# Patient Record
Sex: Male | Born: 1990 | Race: Black or African American | Hispanic: No | Marital: Single | Smoking: Never smoker
Health system: Southern US, Community
[De-identification: ages and names within clinical notes are randomized; demographics above are authoritative.]

## PROBLEM LIST (undated history)

## (undated) DIAGNOSIS — G4730 Sleep apnea, unspecified: Secondary | ICD-10-CM

## (undated) DIAGNOSIS — F32A Depression, unspecified: Secondary | ICD-10-CM

## (undated) DIAGNOSIS — I10 Essential (primary) hypertension: Secondary | ICD-10-CM

## (undated) DIAGNOSIS — J45909 Unspecified asthma, uncomplicated: Secondary | ICD-10-CM

## (undated) DIAGNOSIS — F419 Anxiety disorder, unspecified: Secondary | ICD-10-CM

## (undated) HISTORY — PX: HERNIA REPAIR: SHX51

## (undated) HISTORY — DX: Unspecified asthma, uncomplicated: J45.909

## (undated) HISTORY — DX: Anxiety disorder, unspecified: F41.9

## (undated) HISTORY — DX: Depression, unspecified: F32.A

---
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
## 2020-03-26 ENCOUNTER — Other Ambulatory Visit: Payer: Self-pay

## 2020-03-26 ENCOUNTER — Encounter (HOSPITAL_COMMUNITY): Payer: Self-pay | Admitting: Emergency Medicine

## 2020-03-26 ENCOUNTER — Ambulatory Visit (INDEPENDENT_AMBULATORY_CARE_PROVIDER_SITE_OTHER): Payer: Self-pay

## 2020-03-26 ENCOUNTER — Ambulatory Visit (HOSPITAL_COMMUNITY)
Admission: EM | Admit: 2020-03-26 | Discharge: 2020-03-26 | Disposition: A | Discharge status: Home / Self Care | Payer: Self-pay | Attending: Family Medicine | Admitting: Family Medicine

## 2020-03-26 DIAGNOSIS — M549 Dorsalgia, unspecified: Secondary | ICD-10-CM

## 2020-03-26 DIAGNOSIS — M546 Pain in thoracic spine: Secondary | ICD-10-CM

## 2020-03-26 DIAGNOSIS — M62830 Muscle spasm of back: Secondary | ICD-10-CM

## 2020-03-26 DIAGNOSIS — M5489 Other dorsalgia: Secondary | ICD-10-CM

## 2020-03-26 HISTORY — DX: Essential (primary) hypertension: I10

## 2020-03-26 MED ORDER — KETOROLAC TROMETHAMINE 60 MG/2ML IM SOLN
60.0000 mg | Freq: Once | INTRAMUSCULAR | Status: AC
  Administered 2020-03-26: 60 mg via INTRAMUSCULAR

## 2020-03-26 MED ORDER — TIZANIDINE HCL 4 MG PO TABS: 4.0000 mg | ORAL_TABLET | Freq: Every day | ORAL | 0 refills | Status: DC

## 2020-03-26 MED ORDER — PREDNISONE 20 MG PO TABS: 40.0000 mg | ORAL_TABLET | Freq: Every day | ORAL | 0 refills | Status: DC

## 2020-03-26 MED ORDER — KETOROLAC TROMETHAMINE 60 MG/2ML IM SOLN
INTRAMUSCULAR | Status: AC
  Filled 2020-03-26: qty 2

## 2020-03-26 MED ORDER — DEXAMETHASONE SODIUM PHOSPHATE 10 MG/ML IJ SOLN
10.0000 mg | Freq: Once | INTRAMUSCULAR | Status: AC
  Administered 2020-03-26: 10 mg via INTRAMUSCULAR

## 2020-03-26 MED ORDER — DEXAMETHASONE SODIUM PHOSPHATE 10 MG/ML IJ SOLN
INTRAMUSCULAR | Status: AC
  Filled 2020-03-26: qty 1

## 2020-03-26 NOTE — Discharge Instructions (Addendum)
Your x-ray today is significant for degenerative disc disease which is consistent with arthritis of the back.  This is a chronic condition that will have intermittent flares of back pain which is typically controlled with over-the-counter anti-inflammatories and applications of heat.  Weight loss is encouraged to reduce the frequency of recurrent back pain. Start oral prednisone tomorrow as she did receive a prednisone injection today in clinic.  You may take the muscle relaxers as directed today.  If symptoms worsen or do not improve follow-up with your primary care provider or the Ortho office that I have listed on your discharge paperwork.  Take medication as prescribed.  Avoid taking muscle relaxers while driving as this can induce drowsiness.

## 2020-03-26 NOTE — ED Triage Notes (Signed)
Pt c/o muscle spasms in his mid back onset yesterday. He states the pain is worse with ROM and he has to keep his arm elevated to stop the pain.

## 2020-03-26 NOTE — ED Provider Notes (Signed)
[facility]    CSN: [id number] Arrival date & time: 03/26/20  1022      History   Chief Complaint Chief Complaint  Patient presents with  . Back Pain    HPI Maurice Evans is a 29 y.o. male.   HPI  Maurice Evans is a 29 y.o. male who complains of an injury causing left sided mid back pain for over 1 week.  Unknown if injury occurred.  He reports the pain worsens with certain movements.  He is unable to lay on his back as this seems to worsen the pain.  Pain improves with raising of his right arm or while sitting and offloading pressure on the right side of his body.  He denies any history of recurrent back pain.  He has not taken any medications today for pain.  Denies any pain or changes in urination patterns or urine color.  No history of renal stones. Past Medical History:  Diagnosis Date  . Hypertension     There are no problems to display for this patient.   Past Surgical History:  Procedure Laterality Date  . HERNIA REPAIR         Home Medications    Prior to Admission medications   Not on File    Family History Family History  Problem Relation Age of Onset  . Hypertension Mother   . Thyroid disease Father     Social History Social History   Tobacco Use  . Smoking status: Never Smoker  . Smokeless tobacco: Never Used  Vaping Use  . Vaping Use: Never used  Substance Use Topics  . Alcohol use: Yes    Comment: occasionally  . Drug use: Yes    Frequency: 3.0 times per week    Types: Marijuana     Allergies   Sulfamethoxazole-trimethoprim   Review of Systems Review of Systems Pertinent negatives listed in HPI   Physical Exam Triage Vital Signs ED Triage Vitals  Enc Vitals Group     BP 03/26/20 1147 (!) 131/93     Pulse Rate 03/26/20 1147 83     Resp 03/26/20 1147 18     Temp 03/26/20 1147 98.9 F (37.2 C)     Temp Source 03/26/20 1147 Oral     SpO2 03/26/20 1147 97 %     Weight --      Height --      Head  Circumference --      Peak Flow --      Pain Score 03/26/20 1142 8     Pain Loc --      Pain Edu? --      Excl. in GC? --    No data found.  Updated Vital Signs BP (!) 131/93 (BP Location: Left Arm)   Pulse 83   Temp 98.9 F (37.2 C) (Oral)   Resp 18   SpO2 97%   Visual Acuity Right Eye Distance:   Left Eye Distance:   Bilateral Distance:    Right Eye Near:   Left Eye Near:    Bilateral Near:     Physical Exam   UC Treatments / Results  Labs (all labs ordered are listed, but only abnormal results are displayed) Labs Reviewed - No data to display  EKG   Radiology No results found.  Procedures Procedures (including critical care time)  Medications Ordered in UC Medications - No data to display  Initial Impression / Assessment and Plan / UC Course  I have reviewed the triage vital signs and the nursing notes.  Pertinent labs & imaging results that were available during my care of the patient were reviewed by me and considered in my medical decision making (see chart for details).     Presents with acute unilateral left-sided back pain gradually worsening over the course of week. Imaging significant for degenerative disc disease.  Will cover with a short course of oral prednisone starting tomorrow and for pain muscle relaxer.  Patient advised to follow-up with Ortho or PCP if symptoms worsen and do not improve. Final Clinical Impressions(s) / UC Diagnoses   Final diagnoses:  Back pain  Back muscle spasm     Discharge Instructions     If symptoms worsen or do not improve follow-up with your primary care provider or the Ortho office that I have listed on your discharge paperwork.  Take medication as prescribed.  Avoid taking muscle relaxers while driving as this can induce drowsiness.    ED Prescriptions    None     PDMP not reviewed this encounter.   Bing Neighbors, FNP 03/26/20 1243

## 2020-07-06 ENCOUNTER — Ambulatory Visit: Payer: Self-pay | Admitting: Family Medicine

## 2020-07-06 ENCOUNTER — Encounter: Payer: Self-pay | Admitting: Family Medicine

## 2020-07-06 ENCOUNTER — Other Ambulatory Visit: Payer: Self-pay

## 2020-07-06 ENCOUNTER — Ambulatory Visit (INDEPENDENT_AMBULATORY_CARE_PROVIDER_SITE_OTHER): Payer: BC Managed Care – PPO | Admitting: Family Medicine

## 2020-07-06 VITALS — BP 110/80 | HR 92 | Ht 65.1 in | Wt 287.0 lb

## 2020-07-06 DIAGNOSIS — Z7689 Persons encountering health services in other specified circumstances: Secondary | ICD-10-CM

## 2020-07-06 DIAGNOSIS — Z1159 Encounter for screening for other viral diseases: Secondary | ICD-10-CM

## 2020-07-06 DIAGNOSIS — G4733 Obstructive sleep apnea (adult) (pediatric): Secondary | ICD-10-CM | POA: Insufficient documentation

## 2020-07-06 DIAGNOSIS — Z131 Encounter for screening for diabetes mellitus: Secondary | ICD-10-CM | POA: Diagnosis not present

## 2020-07-06 DIAGNOSIS — R12 Heartburn: Secondary | ICD-10-CM | POA: Insufficient documentation

## 2020-07-06 DIAGNOSIS — R030 Elevated blood-pressure reading, without diagnosis of hypertension: Secondary | ICD-10-CM

## 2020-07-06 DIAGNOSIS — R0683 Snoring: Secondary | ICD-10-CM | POA: Diagnosis not present

## 2020-07-06 DIAGNOSIS — Z114 Encounter for screening for human immunodeficiency virus [HIV]: Secondary | ICD-10-CM

## 2020-07-06 DIAGNOSIS — Z13220 Encounter for screening for lipoid disorders: Secondary | ICD-10-CM

## 2020-07-06 DIAGNOSIS — F322 Major depressive disorder, single episode, severe without psychotic features: Secondary | ICD-10-CM

## 2020-07-06 DIAGNOSIS — Z130 Encounter for screening for diseases of the blood and blood-forming organs and certain disorders involving the immune mechanism: Secondary | ICD-10-CM

## 2020-07-06 DIAGNOSIS — K219 Gastro-esophageal reflux disease without esophagitis: Secondary | ICD-10-CM | POA: Insufficient documentation

## 2020-07-06 LAB — POCT GLYCOSYLATED HEMOGLOBIN (HGB A1C): Hemoglobin A1C: 5.3 % (ref 4.0–5.6)

## 2020-07-06 NOTE — Assessment & Plan Note (Signed)
>>  ASSESSMENT AND PLAN FOR GERD (GASTROESOPHAGEAL REFLUX DISEASE) WRITTEN ON 07/06/2020  1:02 PM BY MULLIS, KIERSTEN P, DO  Endorses symptoms after certain meals and at night. Piror treatments include prilosec, tums. Minimal improvement with PRN use. - follow up to further evaluate - Consider H.Pylori screen initially then starting daily PPI vs H2 blocker

## 2020-07-06 NOTE — Progress Notes (Signed)
Subjective:    Patient ID: Maurice Evans, male    DOB: Dec 08, 1990, 30 y.o.   MRN: [id number]   CC: New Patient  HPI: PMHx: Past Medical History:  Diagnosis Date  . Anxiety   . Asthma    possibly  . Depression   . Hypertension      Surgical Hx: Past Surgical History:  Procedure Laterality Date  . HERNIA REPAIR    . MASTECTOMY FOR GYNECOMASTIA Bilateral 2005    Family Hx: Family History  Problem Relation Age of Onset  . Hypertension Mother   . Hyperlipidemia Mother   . 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 He has never used smokeless tobacco. He reports current alcohol use. He reports current drug use. Frequency: 3.00 times per week. Drug: Marijuana.  Medications: None  ROS: snoring, apnea spells at night, reflux, depression/anxiety Patient reports no  vision/ hearing changes,anorexia, weight change, fever ,adenopathy, persistant / recurrent hoarseness, swallowing issues, chest pain, edema,persistant / recurrent cough, hemoptysis, dyspnea(rest, exertional, paroxysmal nocturnal), gastrointestinal  bleeding (melena, rectal bleeding), abdominal pain, GU symptoms(dysuria, hematuria, pyuria, voiding/incontinence  Issues), syncope, focal weakness, severe memory loss, concerning skin lesions, abnormal bruising/bleeding, major joint swelling.    Preventative Screening Colonoscopy: None PSA: None DEXA:  None Tetanus vaccine: Many years ago Pneumonia vaccine: No Shingles vaccine: No  Heart stress test: none Echocardiogram: none Xrays: Thoracic spine 03/26/20: Mild scoliosis. Diffuse degenerative change. No acute abnormality. CT/MRI: none  Health Maintenance Due  Topic Date Due  . Hepatitis C Screening  Never done  . TETANUS/TDAP  Never done  . INFLUENZA VACCINE  Never done     Smoking status reviewed  Review of Systems   Objective:  BP 110/80   Pulse 92   Ht 5' 5.1" (1.654 m)   Wt 287 lb (130.2 kg)   SpO2 96%   BMI 47.61 kg/m  Vitals and nursing note reviewed  General: well nourished, in no acute distress HEENT: normocephalic, no scleral icterus or conjunctival pallor, no nasal discharge, moist mucous membranes, good dentition without erythema or discharge noted in posterior oropharynx, Friedman tongue position: Grade 2, Mallampati score of Grade 2.5  Neck: supple Cardiac: RRR, clear S1 and S2, no murmurs, rubs, or gallops Respiratory: clear to auscultation bilaterally, no increased work of breathing Neuro: alert and oriented, no focal deficits  Depression screen PHQ 2/9 07/06/2020  Decreased Interest 3  Down, Depressed, Hopeless 3  PHQ - 2 Score 6  Altered sleeping 0  Tired, decreased energy 1  Change in appetite 3  Feeling bad or failure about yourself  3  Trouble concentrating 2  Moving slowly or fidgety/restless 3  Suicidal thoughts 0  PHQ-9 Score 18   Assessment & Plan:   Establish care: Patient here today to establish care. PMH, surgical history, and social history were reviewed. The following concerns below were discussed.   Blood pressure elevated without history of HTN Normotensive today. Continue to monitor.  Risk stratifying labs today: A1C, lipid panel, BMP, CBC  Depression, major, single episode, severe (HCC) Chronic. Worsened after passing of father in 2014. Discussed management options including therapy +/- pharmacologic therapy. Patient  opted to think over management options. Follow up at earliest convenience to further discuss. Denies passive or active SI/HI.  Heart burn Endorses symptoms after certain meals and at night. Piror treatments include prilosec, tums. Minimal improvement with PRN use. - follow up to further evaluate - Consider H.Pylori screen initially then starting daily PPI vs H2 blocker  Snoring Significant other endorses snoring and apnea events throughout the night.  Friedman tongue position: Grade 2, Mallampati score of Grade 2.5  Plan to obtain neck circumference, STOP-BANG and Epworth Sleepiness scale at follow up visit Likely benefit from sleep study   Health Maintenance: - due for TDAP  - due for Flu - patient to get today however opted to wait for follow up visit - HIV and Hep C screening obtained today  No follow-ups on file.   Joana Reamer, DO [facility], PGY3 07/06/2020 1:03 PM

## 2020-07-06 NOTE — Assessment & Plan Note (Signed)
Normotensive today. Continue to monitor.  Risk stratifying labs today: A1C, lipid panel, BMP, CBC

## 2020-07-06 NOTE — Patient Instructions (Signed)
It was a pleasure to see you today!  Thank you for choosing [facility] for your primary care.   Our plans for today were:  I am getting blood work today. I will call you if anything is abnormal.    To keep you healthy, please keep in mind the following health maintenance items that you are due for:   1. Tetanus vaccine  Best Wishes,   Orpah Cobb, DO

## 2020-07-06 NOTE — Assessment & Plan Note (Signed)
Chronic. Worsened after passing of father in 2014. Discussed management options including therapy +/- pharmacologic therapy. Patient opted to think over management options. Follow up at earliest convenience to further discuss. Denies passive or active SI/HI.

## 2020-07-06 NOTE — Assessment & Plan Note (Signed)
Endorses symptoms after certain meals and at night. Piror treatments include prilosec, tums. Minimal improvement with PRN use. - follow up to further evaluate - Consider H.Pylori screen initially then starting daily PPI vs H2 blocker

## 2020-07-06 NOTE — Assessment & Plan Note (Signed)
Significant other endorses snoring and apnea events throughout the night.  Friedman tongue position: Grade 2, Mallampati score of Grade 2.5  Plan to obtain neck circumference, STOP-BANG and Epworth Sleepiness scale at follow up visit Likely benefit from sleep study

## 2020-07-07 ENCOUNTER — Encounter: Payer: Self-pay | Admitting: Family Medicine

## 2020-07-07 LAB — BASIC METABOLIC PANEL
BUN/Creatinine Ratio: 11 (ref 9–20)
BUN: 9 mg/dL (ref 6–20)
CO2: 26 mmol/L (ref 20–29)
Calcium: 9.1 mg/dL (ref 8.7–10.2)
Chloride: 105 mmol/L (ref 96–106)
Creatinine, Ser: 0.83 mg/dL (ref 0.76–1.27)
GFR calc Af Amer: 136 mL/min/{1.73_m2} (ref >59)
GFR calc non Af Amer: 118 mL/min/{1.73_m2} (ref >59)
Glucose: 88 mg/dL (ref 65–99)
Potassium: 4.6 mmol/L (ref 3.5–5.2)
Sodium: 142 mmol/L (ref 134–144)

## 2020-07-07 LAB — CBC
Hematocrit: 40 % (ref 37.5–51.0)
Hemoglobin: 13.3 g/dL (ref 13.0–17.7)
MCH: 29.8 pg (ref 26.6–33.0)
MCHC: 33.3 g/dL (ref 31.5–35.7)
MCV: 90 fL (ref 79–97)
Platelets: 273 10*3/uL (ref 150–450)
RBC: 4.46 x10E6/uL (ref 4.14–5.80)
RDW: 12.3 % (ref 11.6–15.4)
WBC: 5.8 10*3/uL (ref 3.4–10.8)

## 2020-07-07 LAB — LIPID PANEL
Chol/HDL Ratio: 2.5 ratio (ref 0.0–5.0)
Cholesterol, Total: 147 mg/dL (ref 100–199)
HDL: 58 mg/dL (ref >39)
LDL Chol Calc (NIH): 79 mg/dL (ref 0–99)
Triglycerides: 46 mg/dL (ref 0–149)
VLDL Cholesterol Cal: 10 mg/dL (ref 5–40)

## 2020-07-07 LAB — HIV ANTIBODY (ROUTINE TESTING W REFLEX): HIV Screen 4th Generation wRfx: NONREACTIVE

## 2020-07-07 LAB — HEPATITIS C ANTIBODY: Hep C Virus Ab: <0.1 s/co ratio (ref 0.0–0.9)

## 2020-09-16 ENCOUNTER — Ambulatory Visit: Payer: BC Managed Care – PPO | Admitting: Orthopaedic Surgery

## 2020-10-23 ENCOUNTER — Ambulatory Visit (INDEPENDENT_AMBULATORY_CARE_PROVIDER_SITE_OTHER): Payer: BC Managed Care – PPO | Admitting: Family Medicine

## 2020-10-23 ENCOUNTER — Other Ambulatory Visit: Payer: Self-pay

## 2020-10-23 VITALS — BP 124/84 | HR 105

## 2020-10-23 DIAGNOSIS — J069 Acute upper respiratory infection, unspecified: Secondary | ICD-10-CM | POA: Insufficient documentation

## 2020-10-23 NOTE — Progress Notes (Signed)
    SUBJECTIVE:   CHIEF COMPLAINT / HPI:   Cough/congestion: Patient is a 30 year old male who presents today for cough and congestion which started about 4 days. He states he now has a lot of sinus pressure and running nose and some watering of his right eye he thinks is due to the sinus pressure. No fevers. No shortness of breath, vomiting, diarrhea, no sick contacts.   PERTINENT  PMH / PSH: History of seasonal allergies  OBJECTIVE:   BP 124/84   Pulse (!) 105   SpO2 96%    88bmp recheck  General: NAD, pleasant, able to participate in exam HEENT: No pharyngeal erythema, significant congestion present in his nares bilaterally, patient has red watery eyes on physical exam. Cardiac: RRR, no murmurs. Respiratory: CTAB, normal effort, No wheezes, rales or rhonchi Abdomen: Bowel sounds present, nontender Skin: warm and dry, no rashes noted  ASSESSMENT/PLAN:   Viral URI with cough Assessment: 30 y.o. male with symptoms consistent with a viral upper respiratory tract infection.  Patient does not have any concerning symptoms for hospitalization.  No shortness of breath.  Overall differential can include seasonal allergies with postnasal drip leading to the cough and runny nose versus viral URI which can include common cold, influenza, COVID-19, or number of other respiratory viruses.  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  I believe that the patient likely has a viral upper respiratory infection, likely viral sinusitis though seasonal allergies may be to blame.  Unlikely bacterial at this time with his symptoms only going on for about 4 days, no fevers, although the patient could potentially develop sinus infection due to his congestion. Plan: -We will send for COVID-19 testing -Discussed return precautions -Discussed symptomatic treatment and the lack of need for antibiotics.    Jackelyn Poling, DO [facility]    This  note was prepared using Dragon voice recognition software and may include unintentional dictation errors due to the inherent limitations of voice recognition software.

## 2020-10-23 NOTE — Patient Instructions (Signed)
You were evaluated today for a likely upper viral respiratory infection.  We are doing a COVID-19 test.  You should Stata work until it returns.  If you develop any trouble breathing, chest pains, vomiting to the extent that she cannot keep down fluids you should go to the emergency department  You can try to use some symptomatic treatment including steroid nasal sprays such as Flonase, saline flushes of the nose, and sometimes warm beverages can help.  Your symptoms should improve in the next 3 to 4 days.  If they have not improved in the next 1 week please follow-up

## 2020-10-23 NOTE — Assessment & Plan Note (Signed)
Assessment: 30 y.o. male with symptoms consistent with a viral upper respiratory tract infection.  Patient does not have any concerning symptoms for hospitalization.  No shortness of breath.  Overall differential can include seasonal allergies with postnasal drip leading to the cough and runny nose versus viral URI which can include common cold, influenza, COVID-19, or number of other respiratory viruses.  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  I believe that the patient likely has a viral upper respiratory infection, likely viral sinusitis though seasonal allergies may be to blame.  Unlikely bacterial at this time with his symptoms only going on for about 4 days, no fevers, although the patient could potentially develop sinus infection due to his congestion. Plan: -We will send for COVID-19 testing -Discussed return precautions -Discussed symptomatic treatment and the lack of need for antibiotics.

## 2020-10-24 LAB — SARS-COV-2, NAA 2 DAY TAT

## 2020-10-24 LAB — NOVEL CORONAVIRUS, NAA: SARS-CoV-2, NAA: NOT DETECTED

## 2020-12-18 ENCOUNTER — Ambulatory Visit: Payer: BC Managed Care – PPO

## 2021-03-22 ENCOUNTER — Encounter: Payer: Self-pay | Admitting: Family Medicine

## 2021-03-22 ENCOUNTER — Ambulatory Visit: Payer: BC Managed Care – PPO | Admitting: Family Medicine

## 2021-03-22 ENCOUNTER — Ambulatory Visit (INDEPENDENT_AMBULATORY_CARE_PROVIDER_SITE_OTHER): Payer: BC Managed Care – PPO

## 2021-03-22 ENCOUNTER — Other Ambulatory Visit: Payer: Self-pay

## 2021-03-22 VITALS — BP 128/83 | HR 76 | Wt 280.8 lb

## 2021-03-22 DIAGNOSIS — R0683 Snoring: Secondary | ICD-10-CM

## 2021-03-22 DIAGNOSIS — Z23 Encounter for immunization: Secondary | ICD-10-CM | POA: Diagnosis not present

## 2021-03-22 DIAGNOSIS — F411 Generalized anxiety disorder: Secondary | ICD-10-CM | POA: Insufficient documentation

## 2021-03-22 DIAGNOSIS — Z0000 Encounter for general adult medical examination without abnormal findings: Secondary | ICD-10-CM

## 2021-03-22 DIAGNOSIS — B353 Tinea pedis: Secondary | ICD-10-CM | POA: Diagnosis not present

## 2021-03-22 DIAGNOSIS — G4719 Other hypersomnia: Secondary | ICD-10-CM

## 2021-03-22 DIAGNOSIS — F419 Anxiety disorder, unspecified: Secondary | ICD-10-CM

## 2021-03-22 DIAGNOSIS — J301 Allergic rhinitis due to pollen: Secondary | ICD-10-CM

## 2021-03-22 DIAGNOSIS — L989 Disorder of the skin and subcutaneous tissue, unspecified: Secondary | ICD-10-CM | POA: Insufficient documentation

## 2021-03-22 MED ORDER — KETOCONAZOLE 2 % EX CREA: 1.0000 "application " | TOPICAL_CREAM | Freq: Two times a day (BID) | CUTANEOUS | 0 refills | Status: DC

## 2021-03-22 MED ORDER — FLUTICASONE PROPIONATE 50 MCG/ACT NA SUSP: 2.0000 | Freq: Every day | NASAL | 6 refills | Status: DC

## 2021-03-22 NOTE — Patient Instructions (Signed)
It was wonderful to see you today.  Please bring ALL of your medications with you to every visit.   Today we talked about:  -- You will be called by chronic care management team member about therapy for your anxiety.  -- For your reflux please do not eat 30 minutes before going to bed.  I also recommend limiting alcohol use as this can worsen reflux.  We will check your calcium level today.  We will check your diabetes numbers today.  For your feet I sent in a cream to use twice a day for 2 to 4 weeks.  You may need to use this intermittently.  Please also wash your socks really well and change your socks frequently.   -You will be called about the sleep test.  --Please schedule follow-up with your primary doctor in 2 weeks to check in.  -- For your allergies I sent in a nasal spray.  I do recommend restarting your over-the-counter allergy pill such as Claritin.  Thank you for choosing [facility].   Please call [phone] with any questions about today's appointment.  Please be sure to schedule follow up at the front  desk before you leave today.   Terisa Starr, MD   If you are feeling suicidal or depression symptoms worsen please immediately go to:   If you are thinking about harming yourself or having thoughts of suicide, or if you know someone who is, seek help right away. If you are in crisis, make sure you are not left alone.  If someone else is in crisis, make sure he/she/they is not left alone  Call 988 OR 1-800-273-TALK  24 Hour Availability for Walk-IN services  [facility]  [address] [place], [place] [facility] [place] [phone] Crisis [phone]    Other crisis resources:  [facility] [employer] (Domestic Violence, Rape & Victim Assistance [phone]  [facility] [employer] Crisis [facility]    (ONLY from 8am-4pm)    [phone]  [facility] Mobile Crisis Unit (24/7)    [phone]  [place] National Suicide Hotline   [phone] Len Childs)

## 2021-03-22 NOTE — Assessment & Plan Note (Signed)
Discussed at length with patient.  Offered resources.  The suspect alcohol and cannabis may be worsening problem.  Referral to counseling through [facility].  Recommend GAD-7 and MDQ to confirm he does not have symptoms of bipolar at follow-up.  Could consider starting SSRI therapy fluoxetine or similar regimen.

## 2021-03-22 NOTE — Progress Notes (Signed)
SUBJECTIVE:   Chief compliant/HPI: annual examination  Maurice Evans is a 30 y.o. who presents today for an annual exam.   Current concerns: Patient is joined by his girlfriend.  They report the following concerns: Snoring, concern about diabetes, blood pressure, feet infection anxiety and cough.  Cough Patient reports a several day history of mild cough, congestion and phlegm.  He denies chest pain, dyspnea on exertion, lower extremity edema.  He denies fevers.  He is COVID vaccinated.  He does work as a Solicitor.  He reports typical of his seasonal allergies and has some postnasal drip.  Foot infection The patient reports a rash on his foot.  His girlfriend thinks this may be calluses.  Reports intermittently pruritic.  Denies fevers or chills.  Excessive daytime sleepiness Patient reports he thinks he is sleep apnea.  He snores at night.  His family history is significant for sleep apnea.  His wife reports he sometimes does wake up at night.  He typically sleeps on his stomach.  He endorses excessive daytime sleepiness.  He typically drinks between 24 to 40 ounces of beer per night.  Anxiety The patient reports is a longstanding history of anxiety.  He also reports a low mood.  His girlfriend confirms this.  He denies thoughts of hurting himself or others.  He does endorse occasional thoughts of where he may not want to live.  He denies any specific plan for suicidality.  He reports his anxiety is worse when he is outside around people.  He has to wear sunglasses all the time as this helps with his anxiety.  He does report he self medicates by drinking approximately 20 to 40 ounces of beer tonight and smoking cannabis.  He reports that he has side effects from these which can worsen his anxiety.  He is very interested in counseling.    The patient would also like to address his GERD.  We only addressed briefly.  He is in a longstanding history of heartburn and takes 3 to 4  pills of Tums per day.  We discussed this and he is agreeable to discussing further follow-up.   Review of systems form notable for negative for headaches, chest pain, dyspnea, abdominal pain, fevers.Marland Kitchen   Updated history tabs and problem list yes and family history updated to include the fact that his mother has diabetes..   OBJECTIVE:   BP 128/83   Pulse 76   Wt 280 lb 12.8 oz (127.4 kg)   SpO2 98%   BMI 46.58 kg/m   Well-appearing man in no distress.  Neck is thick there is no thyromegaly.  Regular rate and rhythm no murmurs rubs or gallops.  Lungs clear bilaterally.  Bilateral feet examined.  There is thickened hyperpigmentation with overlying scale between digits 3 4 and 4 and 5 on the left foot as well as some extending of the plantar aspect of the left foot.  There is minimal hyperpigmentation or plaque on the right interdigital area.  ASSESSMENT/PLAN:   Snoring Referred for sleep study.   Anxiety Discussed at length with patient.  Offered resources.  The suspect alcohol and cannabis may be worsening problem.  Referral to counseling through [facility].  Recommend GAD-7 and MDQ to confirm he does not have symptoms of bipolar at follow-up.  Could consider starting SSRI therapy fluoxetine or similar regimen.    Elevated BP, monitor, consider 24 hour monitor at follow up  Tinea Pedis, ketoconazole for 2 weeks. Recheck and  may need extended course   Obesity- A1C today.   Annual Examination  See AVS for age appropriate recommendations.   PHQ score 19, reviewed and discussed. Blood pressure reviewed and at goal .  Discussed reducing alcohol use and brief counseling provided. Discussed reducing cannabis use [place] counseling provided. We discussed the potential detrimental health effects of both of these substances.  Based upon USPSTF  guidelines - A1C today - TSH for anxiety, not screening  - Renal function panel given tums use    Follow up in 2 weeks with PCP-- discuss GERD and  anxiety at follow up. Consider 24 hour BP monitor.    Westley Chandler, MD [facility]  To the Is just

## 2021-03-22 NOTE — Assessment & Plan Note (Signed)
-   Referred for sleep study.

## 2021-03-23 ENCOUNTER — Telehealth: Payer: Self-pay | Admitting: Family Medicine

## 2021-03-23 ENCOUNTER — Telehealth: Payer: Self-pay | Admitting: *Deleted

## 2021-03-23 LAB — RENAL FUNCTION PANEL
Albumin: 4.7 g/dL (ref 4.0–5.0)
BUN/Creatinine Ratio: 13 (ref 9–20)
BUN: 11 mg/dL (ref 6–20)
CO2: 23 mmol/L (ref 20–29)
Calcium: 9.8 mg/dL (ref 8.7–10.2)
Chloride: 102 mmol/L (ref 96–106)
Creatinine, Ser: 0.88 mg/dL (ref 0.76–1.27)
Glucose: 91 mg/dL (ref 70–99)
Phosphorus: 3.9 mg/dL (ref 2.8–4.1)
Potassium: 4.4 mmol/L (ref 3.5–5.2)
Sodium: 139 mmol/L (ref 134–144)
eGFR: 118 mL/min/{1.73_m2} (ref >59)

## 2021-03-23 LAB — HEMOGLOBIN A1C
Est. average glucose Bld gHb Est-mCnc: 111 mg/dL
Hgb A1c MFr Bld: 5.5 % (ref 4.8–5.6)

## 2021-03-23 LAB — TSH: TSH: 1.08 u[IU]/mL (ref 0.450–4.500)

## 2021-03-23 NOTE — Telephone Encounter (Signed)
Called with normal results.  All questions answered.  Angelena Sand, MD  Family Medicine Teaching Service   

## 2021-03-23 NOTE — Chronic Care Management (AMB) (Signed)
  Care Management   Note  03/23/2021 Name: [name] MRN: [id number] DOB: April 18, 1991  Maurice Evans is a 30 y.o. year old male who is a primary care patient of Jackelyn Poling, DO. I reached out to Carson Myrtle by phone today in response to a referral sent by Mr. Corinna Capra primary care provider.   Mr. Allegretto was given information about care management services today including:  Care management services include personalized support from designated clinical staff supervised by his physician, including individualized plan of care and coordination with other care providers 24/7 contact phone numbers for assistance for urgent and routine care needs. The patient may stop care management services at any time by phone call to the office staff.  Patient agreed to services and verbal consent obtained.   Follow up plan: Telephone appointment with care management team member scheduled for:03/31/21  [facility] Guide, Embedded Care Coordination [facility]  Care Management  Direct Dial: [phone]

## 2021-03-31 ENCOUNTER — Ambulatory Visit: Payer: BC Managed Care – PPO | Admitting: Licensed Clinical Social Worker

## 2021-03-31 NOTE — Chronic Care Management (AMB) (Signed)
    Clinical Social Work  Care Management   Phone Outreach    03/31/2021 Name: Maurice Evans MRN: [id number] DOB: March 26, 1991  Maurice Evans is a 30 y.o. year old male who is a primary care patient of Jackelyn Poling, DO .   Reason for referral: Mental Health Counseling and Resources.    [facility] LCSW reached out to patient today by phone to introduce self, assess needs and offer Care Management services and interventions.    Unable to keep phone appointment today and requested to reschedule.  Plan:Appointment was rescheduled with [facility] LCSW for 04/01/21 at 8:15  Review of patient status, including review of consultants reports, relevant laboratory and other test results, and collaboration with appropriate care team members and the patient's provider was performed as part of comprehensive patient evaluation and provision of care management services.    Sammuel Hines, LCSW Care Management & Coordination  [facility] / [employer]   [phone]

## 2021-03-31 NOTE — Patient Instructions (Signed)
   It was a pleasure speaking with you today. I am sorry you were unable to keep your phone appointment today.   per your request your appointment is scheduled 04/01/2021  Sammuel Hines, LCSW Care Management & Coordination  [phone]

## 2021-04-01 ENCOUNTER — Ambulatory Visit: Payer: BC Managed Care – PPO | Admitting: Licensed Clinical Social Worker

## 2021-04-01 DIAGNOSIS — Z7189 Other specified counseling: Secondary | ICD-10-CM

## 2021-04-01 NOTE — Patient Instructions (Signed)
Licensed Clinical Social Worker Visit Information  Goals we discussed today:   Goals Addressed             This Visit's Progress    Begin and Stick with Counseling-Depression       Timeframe:  Short-Term Goal Priority:  High Start Date:     04/01/21                        Expected End Date:                       Follow Up Date 04/08/21     Patient Goals/Self-Care Activities: Over the next 7 days Review the list of counseling agencies e-mailed Call the one that you like to schedule your counseling appointment. Review your EMMI educational information on Relieving Stress and Breathing to Relax         Materials provided: Yes:    Maurice Evans was given information about Care Management services today including:  Care Management services include personalized support from designated clinical staff supervised by his physician, including individualized plan of care and coordination with other care providers 24/7 contact phone numbers for assistance for urgent and routine care needs. The patient may stop Care Management services at any time by phone call to the office staff.  Patient agreed to services and verbal consent obtained.   The patient verbalized understanding of instructions, educational materials, and care plan provided today and declined offer to receive copy of patient instructions, educational materials, and care plan.   Follow up plan: Appointment scheduled for SW follow up with client by phone on: 04/08/21  Maurice Hines, LCSW Care Management & Coordination  [facility] / [employer]   [phone]

## 2021-04-01 NOTE — Chronic Care Management (AMB) (Signed)
Care Management  Clinical Social Work Note  04/01/2021 Name: Jabe Jeanbaptiste MRN: [id number] DOB: [birth year]  [name] Imanol Bihl is a 30 y.o. year old male who is a primary care patient of Jackelyn Poling, DO. The CCM team was consulted for assistance with care coordination needs:  Connecting for counseling services    Mr. Rottenberg was given information about Care Management services today including:  Care Management services include personalized support from designated clinical staff supervised by his physician, including individualized plan of care and coordination with other care providers 24/7 contact phone numbers for assistance for urgent and routine care needs. The patient may stop care management services at any time (effective at the end of the month) by phone call to the office staff.  Patient agreed to services and consent obtained.   Engaged with patient by telephone for initial visit in response to provider referral for social care coordination services.   Assessment:Assessed patient's previous and current treatment, coping skills, support system and barriers to care. Patient is currently experiencing symptoms of  anxiety which seems to be exacerbated by life stressors with family and bills.. See Care Plan below for interventions and patient self-care actives.  Recommendation: Patient may benefit from, and is in agreement to do research on therapy options discussed today and start counseling.   Follow up Plan:  Patient would like continued follow-up from CCM LCSW .  per patient's request will follow up in 1 week.  Will call office if needed prior to next encounter.   Review of patient past medical history, allergies, medications, and health status, including review of pertinent consultant reports was performed as part of comprehensive evaluation and provision of care management/care coordination services.   SDOH (Social Determinants of Health) screening and interventions  performed today:  SDOH Interventions    Flowsheet Row Most Recent Value  SDOH Interventions   Stress Interventions Provide Counseling, Other (Comment)  [discussed counseling options]      Advanced Directives Status:Not addressed in this encounter.     Care Plan    Conditions to be addressed/monitored per PCP order: Anxiety and Depression,   Care Plan : General Social Work (Adult)  Updates made by Soundra Pilon, LCSW since 04/01/2021 12:00 AM     Problem: Coping Skills (General Plan of Care)      Goal: Coping Skills Enhanced by connecting for counseling   Start Date: 04/01/2021  This Visit's Progress: On track  Priority: High  Note:   Current barriers:   Chronic Mental Health needs related to symptoms of anxiety and depression  Lacks knowledge of community resource: that take insurance  Needs Support, Education, and Care Coordination in order to meet unmet mental health needs. Clinical Goal(s): patient will work with agencies discussed to address needs related to ongoing counseling   Clinical Interventions:  Assessed patient's previous and current treatment, coping skills, support system and barriers to care  Solution-Focused Strategies employed: to assess needs Provided EMMI education information on Relieving Stress and Breathing to relax  ; Review various resources, discussed options and provided patient information about  Options for mental health treatment based on need, preference and insurance [facility] (consulting) and [employer],   [employer]  ,[facility] Inter-disciplinary care team collaboration (see longitudinal plan of care) Patient Goals/Self-Care Activities: Over the next 7 days Review the list of counseling agencies e-mailed Call the one that you like to schedule your counseling appointment. Review your EMMI educational information  on Relieving Stress and Breathing to Relax     Sammuel Hines, LCSW Care Management & Coordination  [facility] / [employer]   [phone]

## 2021-04-08 ENCOUNTER — Ambulatory Visit: Payer: BC Managed Care – PPO | Admitting: Licensed Clinical Social Worker

## 2021-04-08 DIAGNOSIS — Z7189 Other specified counseling: Secondary | ICD-10-CM

## 2021-04-08 DIAGNOSIS — F322 Major depressive disorder, single episode, severe without psychotic features: Secondary | ICD-10-CM

## 2021-04-08 DIAGNOSIS — F419 Anxiety disorder, unspecified: Secondary | ICD-10-CM

## 2021-04-08 NOTE — Chronic Care Management (AMB) (Signed)
Care Management  Clinical Social Work Note  04/08/2021 Name: Maurice Evans MRN: [id number] DOB: [birth year]  Maurice Evans Maurice Evans is a 30 y.o. year old male who is a primary care patient of Jackelyn Poling, DO. The CCM team was consulted for assistance with care coordination needs.  Connect for ongoing counseling     Consent to Services:  The patient was given information about Care Management services, agreed to services, and gave verbal consent prior to initiation of services.  Please see initial visit note for detailed documentation.   Patient agreed to services today and consent obtained.  Engaged with patient by telephone for follow up visit in response to provider referral for social work care coordination services.   Assessment/Interventions: Assessed patient's current treatment, progress, coping skills, support system and barriers to care.  He is making progress with utilizing interventions discussed to help with managing his symptoms of anxiety.  Has not connected with agencies dicussed . See Care Plan below for interventions and patient self-care actives.  Recommendation: Patient may benefit from, and is in agreement for LCSW to make referral to [facility].   Follow up Plan: Patient would like continued follow-up from CCM LCSW .  per patient's request will follow up in 2 weeks.  Will call office if needed prior to next encounter.   Review of patient past medical history, allergies, medications, and health status, including review of pertinent consultant reports was performed as part of comprehensive evaluation and provision of care management/care coordination services.   SDOH (Social Determinants of Health) screening and interventions performed today:  SDOH Interventions    Flowsheet Row Most Recent Value  SDOH Interventions   SDOH Interventions for the Following Domains Depression  Stress Interventions Provide Counseling  Depression Interventions/Treatment   Counseling  [referral for counseling]       Advanced Directives Status:Not addressed in this encounter.     Care Plan    Conditions to be addressed/monitored per PCP order: Anxiety and Depression,   Care Plan : General Social Work (Adult)  Updates made by Soundra Pilon, LCSW since 04/08/2021 12:00 AM     Problem: Coping Skills (General Plan of Care)      Goal: Coping Skills Enhanced by connecting for counseling   Start Date: 04/01/2021  Recent Progress: On track  Priority: High  Note:   Current barriers:   Has not reached out to counseling agencies discussed Chronic Mental Health needs related to symptoms of anxiety and depression  Lacks knowledge of community resource: that take insurance  Needs Support, Education, and Care Coordination in order to meet unmet mental health needs. Clinical Goal(s): patient will work with agencies discussed to address needs related to ongoing counseling   Clinical Interventions:  Assessed patient's previous and current treatment, coping skills, support system and barriers to care  Solution-Focused Strategies employed: to assess needs Quality of sleep assessed & Sleep Hygiene techniques promoted  Provided EMMI education information on Relieving Stress and Breathing to relax  ; Review various resources, discussed options and provided patient information about  Options for mental health treatment based on need, preference and insurance Referral placed to [facility] Inter-disciplinary care team collaboration (see longitudinal plan of care) Patient Goals/Self-Care Activities: Over the next 14 days I have placed a referral to [facility], they will call you to schedule the appointment Congratulations on doing your relaxed breathing  Continue to review your EMMI educational information on Relieving Stress and Breathing to Relax I have  also e-mailed information for sleep hygiene and sent a new video for sleep for you to  review.    Sammuel Hines, LCSW Care Management & Coordination  [facility] / [employer]   [phone]

## 2021-04-08 NOTE — Patient Instructions (Signed)
Licensed Clinical Social Worker Visit Information  Goals we discussed today:   Goals Addressed             This Visit's Progress    Begin and Stick with Counseling-Depression   On track    Timeframe:  Short-Term Goal Priority:  High Start Date:     04/01/21                        Expected End Date:                       Follow Up Date 04/21/21     Patient Goals/Self-Care Activities:  I have placed a referral to [facility], they will call you to schedule the appointment Congratulations on doing your relaxed breathing  Continue to review your EMMI educational information on Relieving Stress and Breathing to Relax I have also e-mailed information for sleep hygiene and sent a new video for sleep for you to review.       Patient agreed to services and verbal consent obtained.   The patient verbalized understanding of instructions, educational materials, and care plan provided today and declined offer to receive copy of patient instructions, educational materials, and care plan.   Follow up plan: Appointment scheduled for SW follow up with client by phone on: 04/21/21  Sammuel Hines, LCSW Care Management & Coordination  [facility] / [employer]   [phone]

## 2021-04-21 ENCOUNTER — Telehealth: Payer: Self-pay | Admitting: Licensed Clinical Social Worker

## 2021-04-21 ENCOUNTER — Telehealth: Payer: BC Managed Care – PPO

## 2021-04-21 NOTE — Chronic Care Management (AMB) (Signed)
    Clinical Social Work  Care Management   Phone Outreach    04/21/2021 Name: Chadric Kimberley MRN: [id number] DOB: 05-07-1991  [name] Aaden Buckman is a 30 y.o. year old male who is a primary care patient of Jackelyn Poling, DO .   Reason for referral:  Connect for counseling .    F/U phone call today to assess needs, progress and barriers with care plan goals.   Telephone outreach was unsuccessful. A HIPPA compliant phone message was left for the patient providing contact information and requesting a return call.   Plan:CCM LCSW will wait for return call. If no return call is received, Will route chart to Care Guide to see if patient would like to reschedule phone appointment   Review of patient status, including review of consultants reports, relevant laboratory and other test results, and collaboration with appropriate care team members and the patient's provider was performed as part of comprehensive patient evaluation and provision of care management services.     Sammuel Hines, LCSW Care Management & Coordination  [facility] / [employer]   [phone]

## 2021-05-14 ENCOUNTER — Ambulatory Visit (INDEPENDENT_AMBULATORY_CARE_PROVIDER_SITE_OTHER): Payer: BC Managed Care – PPO | Admitting: Family Medicine

## 2021-05-14 ENCOUNTER — Other Ambulatory Visit: Payer: Self-pay

## 2021-05-14 VITALS — HR 96 | Ht 65.1 in | Wt 282.2 lb

## 2021-05-14 DIAGNOSIS — T7840XA Allergy, unspecified, initial encounter: Secondary | ICD-10-CM

## 2021-05-14 DIAGNOSIS — J302 Other seasonal allergic rhinitis: Secondary | ICD-10-CM

## 2021-05-14 DIAGNOSIS — Z720 Tobacco use: Secondary | ICD-10-CM | POA: Diagnosis not present

## 2021-05-14 MED ORDER — CETIRIZINE HCL 10 MG PO TABS: 10.0000 mg | ORAL_TABLET | Freq: Every day | ORAL | 11 refills | Status: DC

## 2021-05-14 NOTE — Assessment & Plan Note (Signed)
-  strongly recommended tobacco cessation, interested in quitting -set goal of smoking 1/2 pack within entire week, patient agreeable, will work to continue to cut down -continue to encourage tobacco cessation at follow up visits

## 2021-05-14 NOTE — Patient Instructions (Addendum)
It was great seeing you today!  Today we discussed your ongoing cough and runny nose, I think this is due to allergies. Please take zyrtec daily and use flonase as well to help your symptoms. Honey can help with your cough.   If you experience much worsening symptoms such as trouble breathing then please contact our office.   Please follow up at your next scheduled appointment in 1 month with PCP, if anything arises between now and then, please don't hesitate to contact our office.   Thank you for allowing [place] to be a part of your medical care!  Thank you, Dr. Robyne Peers

## 2021-05-14 NOTE — Progress Notes (Signed)
    SUBJECTIVE:   CHIEF COMPLAINT / HPI:   Patient presents with symptoms including cough with clear yellow and chest pressure when he coughs. Also endorsing clear rhinorrhea. Reports he has had these symptoms for 2 weeks, has tried cough medicine that works sometimes. Denies sick contacts. 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 Denies fever, chills, nausea, vomiting and dyspnea. Worse in the morning, gets better throughout the day. Denies headaches. Worse when you go outside. Being around dust makes these symptoms worse as well. Smoker, smokes about 1 pack a week. Does not take his flonase although it is prescribed to him.   OBJECTIVE:   Pulse 96   Ht 5' 5.1" (1.654 m)   Wt 282 lb 3.2 oz (128 kg)   SpO2 100%   BMI 46.82 kg/m   General: Patient well-appearing, in no acute distress. HEENT: no evidence of anterior LAD CV: RRR, no murmurs or gallops auscultated Resp: CTAB, no wheezing, rales or rhonchi noted, good air movement throughout all lung fields Psych: mood appropriate   ASSESSMENT/PLAN:   Seasonal allergies -symptoms likely consistent with allergies although may have had a viral infection initially which progressed to continued symptoms from seasonal allergies  -no COVID or flu testing conducted given extended duration of symptoms -prescribed zyrtec and instructed to continue flonase -follow up with PCP as appropriate, may consider allergist and immunologist referral in the future if symptoms persist   Tobacco use -strongly recommended tobacco cessation, interested in quitting -set goal of smoking 1/2 pack within entire week, patient agreeable, will work to continue to cut down -continue to encourage tobacco cessation at follow up visits     Reece Leader, DO [facility]

## 2021-05-14 NOTE — Assessment & Plan Note (Signed)
-  symptoms likely consistent with allergies although may have had a viral infection initially which progressed to continued symptoms from seasonal allergies  -no COVID or flu testing conducted given extended duration of symptoms -prescribed zyrtec and instructed to continue flonase -follow up with PCP as appropriate, may consider allergist and immunologist referral in the future if symptoms persist

## 2021-06-08 ENCOUNTER — Ambulatory Visit: Payer: Self-pay | Admitting: Licensed Clinical Social Worker

## 2021-06-08 NOTE — Patient Instructions (Signed)
   Patient was not contacted during this encounter.  LCSW collaborated with care team to accomplish patient's care plan goal   Miaisabella Bacorn, LCSW Care Management & Coordination  [phone]  

## 2021-06-08 NOTE — Chronic Care Management (AMB) (Signed)
°  Care Management  Collaboration  Note  06/08/2021 Name: Maurice Evans MRN: [id number] DOB: 05-Sep-1990  Maurice Evans is a 31 y.o. year old male who is a primary care patient of Maurice Del, DO. The CCM team was consulted reference care coordination needs for Mental Health Counseling and Resources.  Assessment: Patient was not interviewed or contacted during this encounter.  LCSW no [place] has been able to contact patient.  [place] has attempted 4 unsuccessful outreach and left messages.   Intervention:Conducted brief assessment, recommendations and relevant information discussed.  CCM LCSW collaborated with Maurice Evans at [employer]  to assist with meeting patient's needs.    Follow up Plan:  LCSW will disconnect from care team  If further intervention is needed the care management team is available to follow up after a formal CCM referral is placed.   Review of patient past medical history, allergies, medications, and health status, including review of pertinent consultant reports was performed as part of comprehensive evaluation and provision of care management/care coordination services.   Maurice Evans, [place] / [place]   [phone]

## 2021-06-24 ENCOUNTER — Ambulatory Visit (INDEPENDENT_AMBULATORY_CARE_PROVIDER_SITE_OTHER): Payer: BC Managed Care – PPO | Admitting: Family Medicine

## 2021-06-24 ENCOUNTER — Other Ambulatory Visit: Payer: Self-pay

## 2021-06-24 ENCOUNTER — Encounter: Payer: Self-pay | Admitting: Family Medicine

## 2021-06-24 VITALS — BP 126/64 | HR 98 | Ht 65.1 in | Wt 289.2 lb

## 2021-06-24 DIAGNOSIS — K5900 Constipation, unspecified: Secondary | ICD-10-CM | POA: Diagnosis not present

## 2021-06-24 DIAGNOSIS — K219 Gastro-esophageal reflux disease without esophagitis: Secondary | ICD-10-CM | POA: Diagnosis not present

## 2021-06-24 MED ORDER — OMEPRAZOLE 20 MG PO CPDR: 20.0000 mg | DELAYED_RELEASE_CAPSULE | Freq: Every day | ORAL | 0 refills | Status: DC

## 2021-06-24 MED ORDER — POLYETHYLENE GLYCOL 3350 17 GM/SCOOP PO POWD: ORAL | 0 refills | Status: DC

## 2021-06-24 NOTE — Patient Instructions (Addendum)
It was great to meet you!  -I have sent a medication to help with your heartburn or "reflux". Take 1 pill once daily.  -I have also sent Miralax to help with constipation. You should take a capful twice daily for the next several days. If you start having loose or frequent bowel movements, you can decrease to once daily or just as needed.  Please follow up with your primary care doctor in ~6 weeks.  Take care,  Dr. [facility]

## 2021-06-24 NOTE — Progress Notes (Signed)
° ° °  SUBJECTIVE:   CHIEF COMPLAINT / HPI:   Nausea, Vomiting -Patient reports intermittent nausea and vomiting -Symptoms started 2 weeks ago -Occurs approximately every other day. Has ~2 episodes of vomiting on those days. -Vomit is non-bloody -He denies abdominal pain, diarrhea, fever, chills, or sick contacts.  -Reports his appetite is normal. -Does endorse constipation. Last BM 3-4 days ago and it was hard pellets -His symptoms are not temporally related to eating -Complains of "heartburn sensation" in his chest   PERTINENT  PMH / PSH: obesity  OBJECTIVE:   BP 126/64    Pulse 98    Ht 5' 5.1" (1.654 m)    Wt 289 lb 3.2 oz (131.2 kg)    SpO2 97%    BMI 47.98 kg/m   Gen: alert, well-appearing, NAD HEENT: oropharynx normal CV: RRR, normal S1/S2 Resp: normal respiratory effort GI: hypoactive bowel sounds, abdomen soft, nontender, nondistended Psych: appropriate mood and affect  ASSESSMENT/PLAN:   Gastroesophageal reflux disease Patient with intermittent nausea/vomiting and "heartburn" sensation over past 2 weeks. Per chart review he seems to have a history of GERD which has never been addressed due to other priorities during visits. I suspect GERD is contributing to his current presentation. Will trial daily PPI (omeprazole 20mg ). Counseled on dietary modifications and staying upright for at least 30 minutes after meals as well.  Constipation Last BM 3-4 days ago and was hard in consistency. No concern for obstruction as he is passing gas. He has a hx of constipation in the past that has resulted in nausea/vomiting. Nausea started prior to his constipation but this may still be contributing. -Miralax BID for the next several days. Can titrate to once daily or prn if he begins having loose or frequent BMs    , MD [facility]

## 2021-06-25 DIAGNOSIS — K219 Gastro-esophageal reflux disease without esophagitis: Secondary | ICD-10-CM | POA: Insufficient documentation

## 2021-06-25 DIAGNOSIS — K5900 Constipation, unspecified: Secondary | ICD-10-CM | POA: Insufficient documentation

## 2021-06-25 NOTE — Assessment & Plan Note (Signed)
Patient with intermittent nausea/vomiting and "heartburn" sensation over past 2 weeks. Per chart review he seems to have a history of GERD which has never been addressed due to other priorities during visits. I suspect GERD is contributing to his current presentation. Will trial daily PPI (omeprazole 20mg ). Counseled on dietary modifications and staying upright for at least 30 minutes after meals as well.

## 2021-06-25 NOTE — Assessment & Plan Note (Signed)
Last BM 3-4 days ago and was hard in consistency. No concern for obstruction as he is passing gas. He has a hx of constipation in the past that has resulted in nausea/vomiting. Nausea started prior to his constipation but this may still be contributing. -Miralax BID for the next several days. Can titrate to once daily or prn if he begins having loose or frequent BMs

## 2021-07-09 ENCOUNTER — Other Ambulatory Visit: Payer: Self-pay

## 2021-07-09 ENCOUNTER — Ambulatory Visit (INDEPENDENT_AMBULATORY_CARE_PROVIDER_SITE_OTHER): Payer: BC Managed Care – PPO | Admitting: Family Medicine

## 2021-07-09 ENCOUNTER — Encounter: Payer: Self-pay | Admitting: Family Medicine

## 2021-07-09 VITALS — BP 128/61 | HR 60 | Ht 65.0 in | Wt 287.2 lb

## 2021-07-09 DIAGNOSIS — R051 Acute cough: Secondary | ICD-10-CM | POA: Diagnosis not present

## 2021-07-09 DIAGNOSIS — B9689 Other specified bacterial agents as the cause of diseases classified elsewhere: Secondary | ICD-10-CM | POA: Diagnosis not present

## 2021-07-09 DIAGNOSIS — J329 Chronic sinusitis, unspecified: Secondary | ICD-10-CM | POA: Diagnosis not present

## 2021-07-09 MED ORDER — AMOXICILLIN-POT CLAVULANATE 875-125 MG PO TABS: 1.0000 | ORAL_TABLET | Freq: Two times a day (BID) | ORAL | 0 refills | Status: AC

## 2021-07-09 NOTE — Progress Notes (Signed)
° ° °  SUBJECTIVE:   CHIEF COMPLAINT / HPI:   Patient reports that last Monday (last 23) he began to have cough with phlegm, increased mucus, change in taste, some difficulty with sleeping secondary to the mucus.  The symptoms have not elevated despite over-the-counter medications and using a Nettie pot at home.  He denies fever, nausea, vomiting, abdominal pain, headache, vision changes.  Patient works at a elementary school as a Copy and is wanting to get tested for respiratory sicknesses as well.  PERTINENT  PMH / PSH: Possible childhood asthma, allergies, obesity, history of marijuana smoking (stopped 2 months ago after intermittent use x3 years).  OBJECTIVE:   BP 128/61    Pulse 60    Ht 5\' 5"  (1.651 m)    Wt 287 lb 3.2 oz (130.3 kg)    SpO2 100%    BMI 47.79 kg/m   Gen: well-appearing, NAD CV: RRR, no m/r/g appreciated, no peripheral edema Pulm: CTAB, no wheezes/crackles GI: soft, non-tender, non-distended HEENT: Maxillary sinuses tender bilaterally, allergic shiners present, TMs clear bilaterally, throat without erythema, postnasal drip noted   ASSESSMENT/PLAN:   Bacterial sinusitis Symptoms appear consistent with a bacterial sinusitis given maxillary tenderness and persistent symptoms > 10 days.  Unlikely that a virus would still cause symptoms at this point without improvement.  We will treat with antibiotics at this time.  Patient having cough secondary to his phlegm, at this time do not feel that it is COVID or flu but test were obtained as he does work at a school and is exposed to these illnesses. - COVID and flu test send out collected - Augmentin [id number] twice daily x5 days - If patient continues to have symptoms >5 days we can consider extending the course to 7 to 10 days - Return precautions given   , DO [place]

## 2021-07-09 NOTE — Patient Instructions (Addendum)
Given the fact that your symptoms have lasted for so long, I am concerned of more of a bacterial infection in setting of virus picture.  Get a prescribe you an antibiotic that you will take twice daily for the next 5 days, if you do not have any improvement let our office know and we will see if we need to change or extend the antibiotics.  Your COVID and flu test are being sent out and will take a little bit of time to come back.  I will let you know if the results are abnormal.   Sinusitis, Adult Sinusitis is soreness and swelling (inflammation) of your sinuses. Sinuses are hollow spaces in the bones around your face. They are located: Around your eyes. In the middle of your forehead. Behind your nose. In your cheekbones. Your sinuses and nasal passages are lined with a fluid called mucus. Mucus drains out of your sinuses. Swelling can trap mucus in your sinuses. This lets germs (bacteria, virus, or fungus) grow, which leads to infection. Most of the time, this condition is caused by a virus. What are the causes? This condition is caused by: Allergies. Asthma. Germs. Things that block your nose or sinuses. Growths in the nose (nasal polyps). Chemicals or irritants in the air. Fungus (rare). What increases the risk? You are more likely to develop this condition if: You have a weak body defense system (immune system). You do a lot of swimming or diving. You use nasal sprays too much. You smoke. What are the signs or symptoms? The main symptoms of this condition are pain and a feeling of pressure around the sinuses. Other symptoms include: Stuffy nose (congestion). Runny nose (drainage). Swelling and warmth in the sinuses. Headache. Toothache. A cough that may get worse at night. Mucus that collects in the throat or the back of the nose (postnasal drip). Being unable to smell and taste. Being very tired (fatigue). A fever. Sore throat. Bad breath. How is this diagnosed? This  condition is diagnosed based on: Your symptoms. Your medical history. A physical exam. Tests to find out if your condition is short-term (acute) or long-term (chronic). Your doctor may: Check your nose for growths (polyps). Check your sinuses using a tool that has a light (endoscope). Check for allergies or germs. Do imaging tests, such as an MRI or CT scan. How is this treated? Treatment for this condition depends on the cause and whether it is short-term or long-term. If caused by a virus, your symptoms should go away on their own within 10 days. You may be given medicines to relieve symptoms. They include: Medicines that shrink swollen tissue in the nose. Medicines that treat allergies (antihistamines). A spray that treats swelling of the nostrils.  Rinses that help get rid of thick mucus in your nose (nasal saline washes). If caused by bacteria, your doctor may wait to see if you will get better without treatment. You may be given antibiotic medicine if you have: A very bad infection. A weak body defense system. If caused by growths in the nose, you may need to have surgery. Follow these instructions at home: Medicines Take, use, or apply over-the-counter and prescription medicines only as told by your doctor. These may include nasal sprays. If you were prescribed an antibiotic medicine, take it as told by your doctor. Do not stop taking the antibiotic even if you start to feel better. Hydrate and humidify  Drink enough water to keep your pee (urine) pale yellow. Use a  cool mist humidifier to keep the humidity level in your home above 50%. Breathe in steam for 10-15 minutes, 3-4 times a day, or as told by your doctor. You can do this in the bathroom while a hot shower is running. Try not to spend time in cool or dry air. Rest Rest as much as you can. Sleep with your head raised (elevated). Make sure you get enough sleep each night. General instructions  Put a warm, moist  washcloth on your face 3-4 times a day, or as often as told by your doctor. This will help with discomfort. Wash your hands often with soap and water. If there is no soap and water, use hand sanitizer. Do not smoke. Avoid being around people who are smoking (secondhand smoke). Keep all follow-up visits as told by your doctor. This is important. Contact a doctor if: You have a fever. Your symptoms get worse. Your symptoms do not get better within 10 days. Get help right away if: You have a very bad headache. You cannot stop throwing up (vomiting). You have very bad pain or swelling around your face or eyes. You have trouble seeing. You feel confused. Your neck is stiff. You have trouble breathing. Summary Sinusitis is swelling of your sinuses. Sinuses are hollow spaces in the bones around your face. This condition is caused by tissues in your nose that become inflamed or swollen. This traps germs. These can lead to infection. If you were prescribed an antibiotic medicine, take it as told by your doctor. Do not stop taking it even if you start to feel better. Keep all follow-up visits as told by your doctor. This is important. This information is not intended to replace advice given to you by your health care provider. Make sure you discuss any questions you have with your health care provider. Document Revised: 10/23/2017 Document Reviewed: 10/23/2017 Elsevier Patient Education  2022 [employer].

## 2021-07-11 LAB — COVID-19, FLU A+B NAA
Influenza A, NAA: NOT DETECTED
Influenza B, NAA: NOT DETECTED
SARS-CoV-2, NAA: NOT DETECTED

## 2022-02-08 IMAGING — DX DG THORACIC SPINE 2V
2 series · 2 of 2 positions shown · non-contrast
Comparison: No recent.

CLINICAL DATA: Back pain.

EXAM:
THORACIC SPINE 2 VIEWS

[t-spine ap]
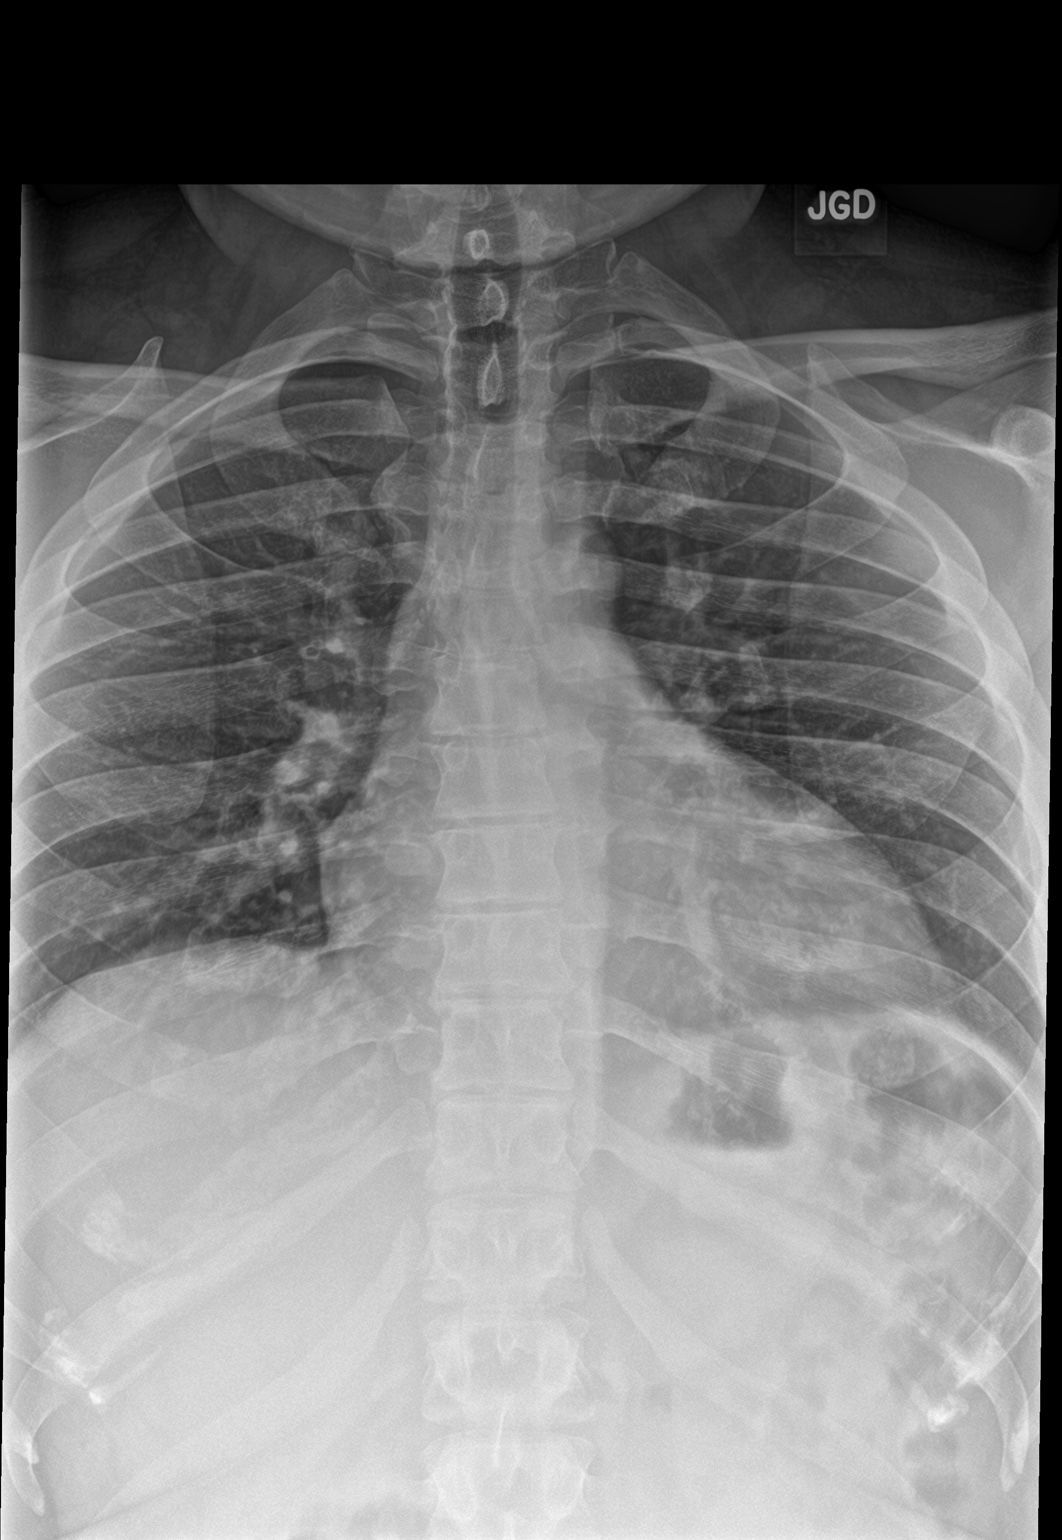

[t-spine lat]
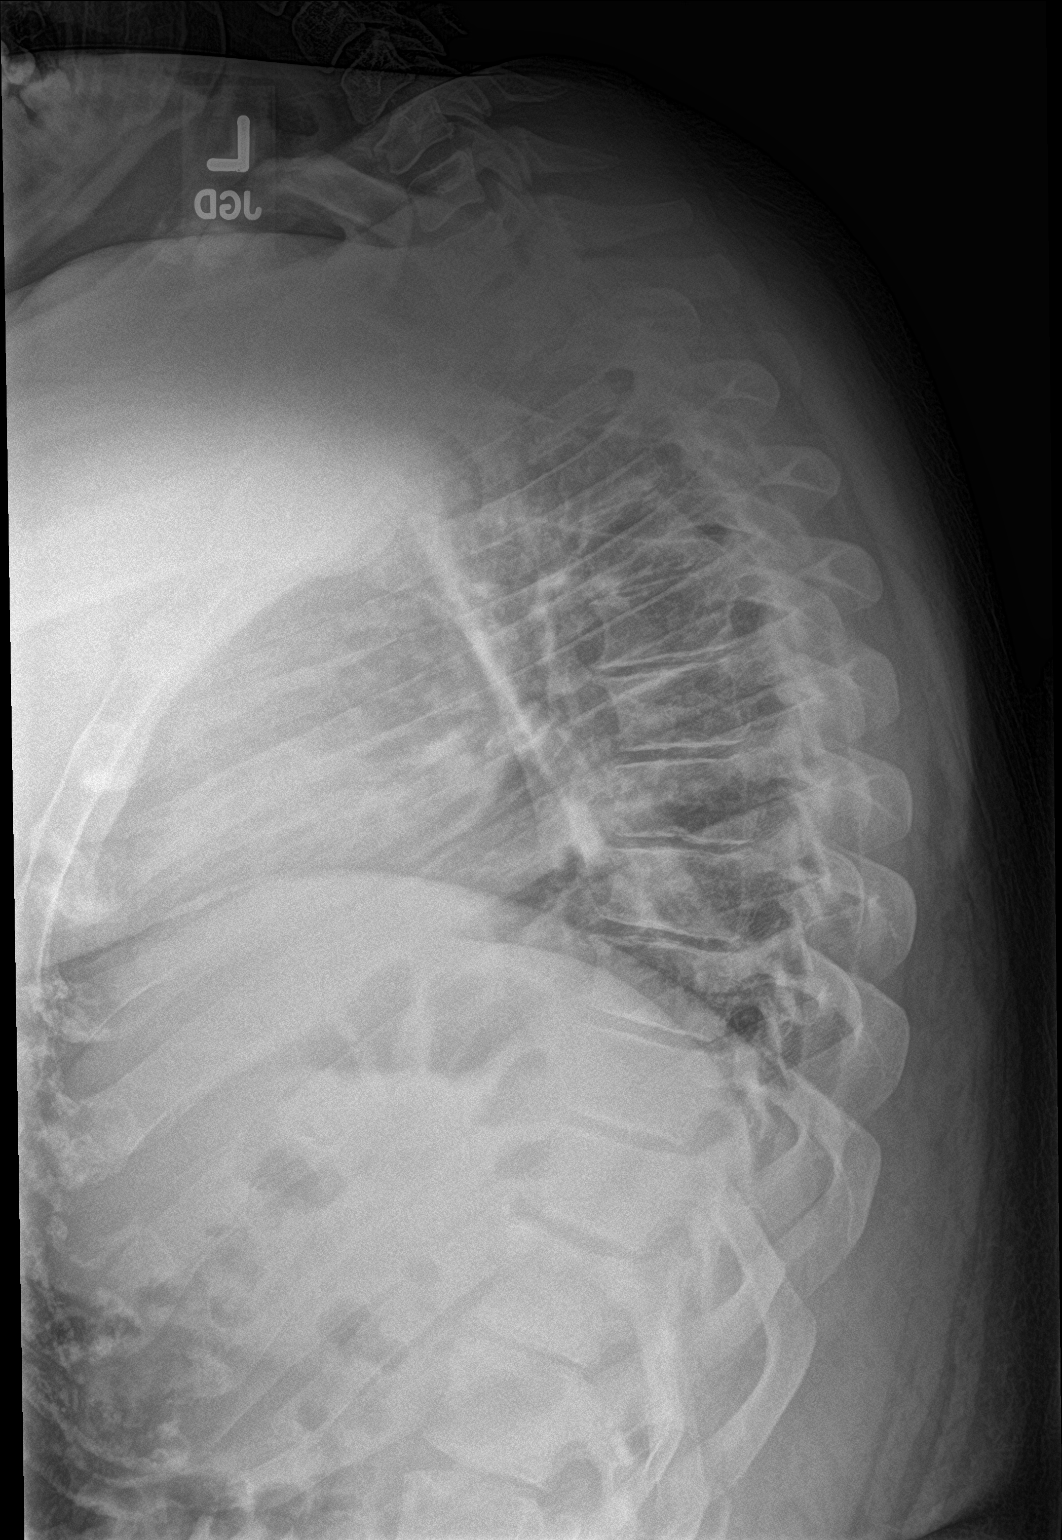

[2 of 2 positions shown; findings below may reference images not displayed]

FINDINGS: Mild scoliosis. Diffuse degenerative change. No acute bony
abnormality. No evidence of fracture. Paraspinal soft tissues are
unremarkable.
IMPRESSION: Mild scoliosis. Diffuse degenerative change. No acute abnormality.

## 2022-02-24 ENCOUNTER — Encounter: Payer: Self-pay | Admitting: Family Medicine

## 2022-02-24 ENCOUNTER — Ambulatory Visit (INDEPENDENT_AMBULATORY_CARE_PROVIDER_SITE_OTHER): Payer: BC Managed Care – PPO | Admitting: Family Medicine

## 2022-02-24 VITALS — BP 151/92 | HR 97 | Ht 65.0 in | Wt 299.0 lb

## 2022-02-24 DIAGNOSIS — F4000 Agoraphobia, unspecified: Secondary | ICD-10-CM | POA: Diagnosis not present

## 2022-02-24 DIAGNOSIS — R7303 Prediabetes: Secondary | ICD-10-CM

## 2022-02-24 DIAGNOSIS — Z23 Encounter for immunization: Secondary | ICD-10-CM

## 2022-02-24 DIAGNOSIS — F419 Anxiety disorder, unspecified: Secondary | ICD-10-CM

## 2022-02-24 DIAGNOSIS — Z13220 Encounter for screening for lipoid disorders: Secondary | ICD-10-CM | POA: Diagnosis not present

## 2022-02-24 DIAGNOSIS — R0683 Snoring: Secondary | ICD-10-CM

## 2022-02-24 DIAGNOSIS — G4719 Other hypersomnia: Secondary | ICD-10-CM

## 2022-02-24 DIAGNOSIS — R739 Hyperglycemia, unspecified: Secondary | ICD-10-CM | POA: Diagnosis not present

## 2022-02-24 DIAGNOSIS — R030 Elevated blood-pressure reading, without diagnosis of hypertension: Secondary | ICD-10-CM

## 2022-02-24 NOTE — Patient Instructions (Addendum)
It was nice seeing you today!  Referral to psychiatrist.  Specific Provider options Psychology Today  [email or web address] click on find a therapist  enter your zip code left side and select or tailor a therapist for your specific need.   Stay well, Littie Deeds, MD [facility] [phone]  --  Make sure to check out at the front desk before you leave today.  Please arrive at least 15 minutes prior to your scheduled appointments.  If you had blood work today, I will send you a MyChart message or a letter if results are normal. Otherwise, I will give you a call.  If you had a referral placed, they will call you to set up an appointment. Please give [place] a call if you don't hear back in the next 2 weeks.  If you need additional refills before your next appointment, please call your pharmacy first.   --   Therapy and Counseling Resources Most providers on this list will take Medicaid. Patients with commercial insurance or Medicare should contact their insurance company to get a list of in network providers.  [facility] (takes children) Location 1: [address] [place], [place] [address] Location 2: [address] [place], [place] [address] [phone]   [facility] (spanish speaking therapist available)(habla espanol)(take medicare and medicaid)  [address], [place], [place] [address], [email or web address] [email or web address] [phone]  [facility]ing [address] [place], [place] [address] [phone]  [facility]   [address] Suite [place], [place] [address]      [phone]  [facility] (spanish available) [address]  [place], [place] [address] [phone]  [facility] (take [facility] and medicare) [address], [address]  [place], [place] [address]       [phone]     [facility] (virtual  only) [phone]  Jovita Kussmaul [facility] [address] [place], [place] [phone]  [facility]:  [address] [place] [phone]  [facility]:  [address] [place] [phone]  [facility] (under & uninsured) [address]   [place] [phone]    [email or web address]    [facility] [address]  [place]    [phone]  [facility] -[address]     Phone:  [phone]     [facility]  [address]  [phone]   [facility] [address]      [place], [place] [phone] ext [phone]  [facility]: [address] [place], [place], [place]  [phone]   [facility] (Spanish therapist) [email or web address]  [address] [place]  [address]   [place] [address]    [phone]    [facility]   [address],  [place], [place]  [phone]   [facility]  [address] [place]  [phone]  [facility]  [address] [place], [place]        [phone]  Open Access/Walk In Clinic under & uninsured  [facility]  [address] [place], [place] [phone] Crisis [phone]  [facility] [place],  (Spanish)   [address], [place]: [phone]) 8:30 - 12; 1 - 2:30  [facility] [employer],  [address], [employer]  [place]    ([phone]):8:30 - 12; 2 - 3PM  [facility] [address],  [address]  [address]; [phone]):   Mon - Fri 8 AM - 5 PM  [facility] [place]  MWF 12:30 to 3:00 or call to schedule an appointment  [phone]  Specific Provider options Psychology Today  [email or web address] click on find a therapist  enter your zip code left side and  select or tailor a therapist for your specific need.   [facility] Provider Directory [email or web address]  (Medicaid)   Follow all drop down to find a provider  [place] or [email or web address] [address], [place], [place] Recovery support and educational   24- Hour Availability:   [facility]  [address] [place], [place] Crisis [phone]  [facility] [employer] [phone]  [place]  [phone]   [place]  [phone] (after hours)  [facility]/Mobile Crisis   [phone]  [place] National Suicide Hotline  [phone] Diamantina Monks)  Call 911 or go to emergency room  [facility]  [phone]);  [place] and [employer]  [phone]); [place], [place], [place], [place], [place], [place], [place]

## 2022-02-24 NOTE — Progress Notes (Signed)
SUBJECTIVE:   CHIEF COMPLAINT / HPI:  Chief Complaint  Patient presents with   Diabetes check   Anxiety     He has anxiety around large crowds of people for the past year.  He does not do well with groups of more than 4 people.  He thinks this started after he got the COVID-vaccine.  He avoids places like grocery stores. Feels paranoid like people are watching. He will wear shades in public.  He is wanting start medication for this.  He did try counseling but he did not find it helpful but he is amenable to trying a different therapist.  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  He does not typically meet new people, primarily stays in his room and plays video games even when there are guests over at his house.  Denies SI.  MDQ questionnaire was administered and upon further questioning, patient does report that he has had times where he has not slept for a few days and did not feel tired.  He reports he no longer uses cannabis.  He has cut down on alcohol, reports he only drinks 2 days out of the week.  Additionally, he would like to be tested for diabetes because he has a family history of diabetes.  Patient reports sleep study was previously ordered for him but he was never contacted about it.  This was ordered for daytime somnolence.  He also reports that he has had apneic episodes at night.  PERTINENT  PMH / PSH: Anxiety  Patient Care Team: Colletta Maryland, MD as PCP - General (Family Medicine)   OBJECTIVE:   BP (!) 151/92   Pulse 97   Ht 5\' 5"  (1.651 m)   Wt 299 lb (135.6 kg)   SpO2 98%   BMI 49.76 kg/m   Physical Exam Constitutional:      General: He is not in acute distress.    Appearance: He is obese.  Cardiovascular:     Rate and Rhythm: Normal rate and regular rhythm.  Pulmonary:     Effort: Pulmonary effort is normal. No respiratory distress.     Breath sounds: Normal breath sounds.  Neurological:     Mental Status: He is  alert.  Psychiatric:     Comments: Affect is appropriate         02/24/2022    2:24 PM  Depression screen PHQ 2/9  Decreased Interest 0  Down, Depressed, Hopeless 0  PHQ - 2 Score 0  Altered sleeping 0  Tired, decreased energy 1  Change in appetite 0  Feeling bad or failure about yourself  0  Trouble concentrating 0  Moving slowly or fidgety/restless 0  Suicidal thoughts 0  PHQ-9 Score 1  Difficult doing work/chores Not difficult at all     {Show previous vital signs (optional):23777}    ASSESSMENT/PLAN:   Snoring New order for sleep study placed, contact information provided  Blood pressure elevated without history of HTN Elevated in office today we will need to continue to monitor  Anxiety Anxiety around crowds and social situations sounds like some element of agoraphobia and/or social anxiety.  Considered starting pharmacotherapy; however, concern for underlying bipolar disorder with abnormal MDQ (scanned into chart). - referral psychiatry - therapy resources provided   HCM - Tdap given - flu shot today - lipid panel, A1c  Return in about 3 months (around 05/26/2022) for f/u anxiety.   Zola Button, MD La[facility] Penning  [place]

## 2022-02-25 ENCOUNTER — Encounter: Payer: Self-pay | Admitting: Family Medicine

## 2022-02-25 DIAGNOSIS — R7303 Prediabetes: Secondary | ICD-10-CM | POA: Insufficient documentation

## 2022-02-25 LAB — LIPID PANEL
Chol/HDL Ratio: 3.8 ratio (ref 0.0–5.0)
Cholesterol, Total: 159 mg/dL (ref 100–199)
HDL: 42 mg/dL (ref >39)
LDL Chol Calc (NIH): 103 mg/dL — ABNORMAL HIGH (ref 0–99)
Triglycerides: 71 mg/dL (ref 0–149)
VLDL Cholesterol Cal: 14 mg/dL (ref 5–40)

## 2022-02-25 LAB — HEMOGLOBIN A1C
Est. average glucose Bld gHb Est-mCnc: 120 mg/dL
Hgb A1c MFr Bld: 5.8 % — ABNORMAL HIGH (ref 4.8–5.6)

## 2022-02-25 NOTE — Assessment & Plan Note (Signed)
New order for sleep study placed, contact information provided

## 2022-02-25 NOTE — Assessment & Plan Note (Signed)
Anxiety around crowds and social situations sounds like some element of agoraphobia and/or social anxiety.  Considered starting pharmacotherapy; however, concern for underlying bipolar disorder with abnormal MDQ (scanned into chart). - referral psychiatry - therapy resources provided

## 2022-02-25 NOTE — Assessment & Plan Note (Signed)
Elevated in office today we will need to continue to monitor

## 2022-03-01 ENCOUNTER — Telehealth: Payer: Self-pay

## 2022-03-01 NOTE — Telephone Encounter (Signed)
Patient calls nurse line regarding results. Advised of results per letter from Dr. Nancy Fetter.   Patient will follow up in December for follow up.   No further questions at this time.   Talbot Grumbling, RN

## 2022-03-11 ENCOUNTER — Encounter (HOSPITAL_COMMUNITY): Payer: Self-pay

## 2022-03-11 ENCOUNTER — Ambulatory Visit (HOSPITAL_COMMUNITY)
Admission: EM | Admit: 2022-03-11 | Discharge: 2022-03-11 | Disposition: A | Discharge status: Home / Self Care | Payer: BC Managed Care – PPO | Attending: Nurse Practitioner | Admitting: Nurse Practitioner

## 2022-03-11 DIAGNOSIS — Z792 Long term (current) use of antibiotics: Secondary | ICD-10-CM | POA: Insufficient documentation

## 2022-03-11 DIAGNOSIS — J069 Acute upper respiratory infection, unspecified: Secondary | ICD-10-CM | POA: Insufficient documentation

## 2022-03-11 DIAGNOSIS — Z1152 Encounter for screening for COVID-19: Secondary | ICD-10-CM | POA: Diagnosis not present

## 2022-03-11 DIAGNOSIS — Z79899 Other long term (current) drug therapy: Secondary | ICD-10-CM | POA: Diagnosis not present

## 2022-03-11 LAB — POCT RAPID STREP A, ED / UC: Streptococcus, Group A Screen (Direct): NEGATIVE

## 2022-03-11 LAB — RESP PANEL BY RT-PCR (RSV, FLU A&B, COVID)  RVPGX2
Influenza A by PCR: NEGATIVE
Influenza B by PCR: NEGATIVE
Resp Syncytial Virus by PCR: NEGATIVE
SARS Coronavirus 2 by RT PCR: NEGATIVE

## 2022-03-11 MED ORDER — AMOXICILLIN-POT CLAVULANATE 875-125 MG PO TABS: 1.0000 | ORAL_TABLET | Freq: Two times a day (BID) | ORAL | 0 refills | Status: DC

## 2022-03-11 MED ORDER — BENZONATATE 100 MG PO CAPS: 100.0000 mg | ORAL_CAPSULE | Freq: Three times a day (TID) | ORAL | 0 refills | Status: DC

## 2022-03-11 NOTE — ED Triage Notes (Signed)
Patient having cough, chest congestion, and nasal congestion starting last weekend\. Unable to breathe out the nose. Dry cough.   No known sick exposure. No medical history of sinus or respiratory problems.   Patient has tried Theraflu with no relief.

## 2022-03-11 NOTE — ED Provider Notes (Signed)
[facility]    CSN: [id number] Arrival date & time: 03/11/22  0841      History   Chief Complaint Chief Complaint  Patient presents with   Cough   Nasal Congestion    HPI Maurice Evans is a 31 y.o. male.   HPI  He is complaining of 5 days of nasal congestion, right nare stuffiness, nonproductive cough,and chest congestion, shortness of breath. He has pain with cough. So he tries to avoid coughing. Denies  fever, chills, new sore throat, new loss of smell or taste, chest pain, nausea, or diarrhea. .Exposure unknown he works at the [employer] The current treatment has been OTC Theraflu with no relief.   Past Medical History:  Diagnosis Date   Anxiety    Asthma    possibly   Depression    Hypertension     Patient Active Problem List   Diagnosis Date Noted   Prediabetes 02/25/2022   Gastroesophageal reflux disease 06/25/2021   Constipation 06/25/2021   Seasonal allergies 05/14/2021   Tobacco use 05/14/2021   Anxiety 03/22/2021   Tinea pedis of left foot 03/22/2021   Depression, major, single episode, severe (HCC) 07/06/2020   Blood pressure elevated without history of HTN 07/06/2020   Snoring 07/06/2020   Heart burn 07/06/2020    Past Surgical History:  Procedure Laterality Date   HERNIA REPAIR     MASTECTOMY FOR GYNECOMASTIA Bilateral 2005       Home Medications    Prior to Admission medications   Medication Sig Start Date End Date Taking? Authorizing Provider  amoxicillin-clavulanate (AUGMENTIN) 875-125 MG tablet Take 1 tablet by mouth every 12 (twelve) hours. 03/11/22  Yes Layne Lebon, Shana Chute, NP  benzonatate (TESSALON) 100 MG capsule Take 1 capsule (100 mg total) by mouth every 8 (eight) hours. 03/11/22  Yes Barbette Merino, NP  omeprazole (PRILOSEC) 20 MG capsule Take 1 capsule (20 mg total) by mouth daily. 06/24/21   Maury Dus, MD  polyethylene glycol powder (GLYCOLAX/MIRALAX) 17 GM/SCOOP powder Take 1 capful twice daily for 3-5  days. Then can decrease to once daily or as needed. 06/24/21   Maury Dus, MD    Family History Family History  Problem Relation Age of Onset   Hypertension Mother    Hyperlipidemia Mother    Diabetes Mother    Thyroid disease Father     Social History Social History   Tobacco Use   Smoking status: Never   Smokeless tobacco: Never  Vaping Use   Vaping Use: Never used  Substance Use Topics   Alcohol use: Yes    Comment: occasionally   Drug use: Yes    Frequency: 3.0 times per week    Types: Marijuana     Allergies   Sulfamethoxazole-trimethoprim   Review of Systems Review of Systems   Physical Exam Triage Vital Signs ED Triage Vitals  Enc Vitals Group     BP 03/11/22 0954 (!) 144/87     Pulse Rate 03/11/22 0954 92     Resp 03/11/22 0954 16     Temp 03/11/22 0954 98.7 F (37.1 C)     Temp Source 03/11/22 0954 Oral     SpO2 03/11/22 0954 96 %     Weight --      Height --      Head Circumference --      Peak Flow --      Pain Score 03/11/22 0957 7     Pain Loc --  Pain Edu? --      Excl. in GC? --    No data found.  Updated Vital Signs BP (!) 144/87 (BP Location: Right Arm)   Pulse 92   Temp 98.7 F (37.1 C) (Oral)   Resp 16   SpO2 96%   Visual Acuity Right Eye Distance:   Left Eye Distance:   Bilateral Distance:    Right Eye Near:   Left Eye Near:    Bilateral Near:     Physical Exam Constitutional:      Appearance: He is obese.  HENT:     Head: Normocephalic and atraumatic.     Right Ear: Tympanic membrane normal.     Left Ear: Tympanic membrane normal.     Nose: Congestion present. No rhinorrhea.     Mouth/Throat:     Mouth: Mucous membranes are moist.     Tonsils: No tonsillar exudate. 3+ on the right. 3+ on the left.  Cardiovascular:     Rate and Rhythm: Normal rate and regular rhythm.     Pulses: Normal pulses.     Heart sounds: Normal heart sounds.  Pulmonary:     Effort: Pulmonary effort is normal.   Musculoskeletal:     Cervical back: Normal range of motion.  Skin:    General: Skin is warm and dry.     Capillary Refill: Capillary refill takes less than 2 seconds.  Neurological:     General: No focal deficit present.     Mental Status: He is alert and oriented to person, place, and time.  Psychiatric:        Mood and Affect: Mood normal.        Behavior: Behavior normal.      UC Treatments / Results  Labs (all labs ordered are listed, but only abnormal results are displayed) Labs Reviewed  RESP PANEL BY RT-PCR (RSV, FLU A&B, COVID)  [id number]  POCT RAPID STREP A, ED / UC    EKG   Radiology No results found.  Procedures Procedures (including critical care time)  Medications Ordered in UC Medications - No data to display  Initial Impression / Assessment and Plan / UC Course  I have reviewed the triage vital signs and the nursing notes.  Pertinent labs & imaging results that were available during my care of the patient were reviewed by me and considered in my medical decision making (see chart for details).     Head and chest congestion Final Clinical Impressions(s) / UC Diagnoses   Final diagnoses:  Upper respiratory tract infection, unspecified type     Discharge Instructions      Your COVID, Influenza and RSV are pending  Strep Test is negative You may have an Upper Respiratory Infection Augmentin 875 mg twice a day for 10 days and use Mucinex D to help break up the congestion  We encourage conservative treatment with symptom relief. We encourage you to use Tylenol alternating with Ibuprofen for your fever if not contraindicated. (Remember to use as directed do not exceed daily dosing recommendations) We also encourage salt water gargles for your sore throat. You should also consider throat lozenges and chloraseptic spray.  Your cough can be soothed with a cough suppressant. We have prescribed you a cough suppressant to be taken as  directed Benzonatate  100 mg every 8 hours as needed        ED Prescriptions     Medication Sig Dispense Auth. Provider   amoxicillin-clavulanate (AUGMENTIN) 875-125 MG tablet  Take 1 tablet by mouth every 12 (twelve) hours. 14 tablet Dionisio David M, NP   benzonatate (TESSALON) 100 MG capsule Take 1 capsule (100 mg total) by mouth every 8 (eight) hours. 21 capsule Vevelyn Francois, NP      PDMP not reviewed this encounter.   Dionisio David [place], [place] 03/11/22 [phone]

## 2022-03-11 NOTE — Discharge Instructions (Addendum)
Your COVID, Influenza and RSV are pending  Strep Test is negative You may have an Upper Respiratory Infection Augmentin 875 mg twice a day for 10 days and use Mucinex D to help break up the congestion  We encourage conservative treatment with symptom relief. We encourage you to use Tylenol alternating with Ibuprofen for your fever if not contraindicated. (Remember to use as directed do not exceed daily dosing recommendations) We also encourage salt water gargles for your sore throat. You should also consider throat lozenges and chloraseptic spray.  Your cough can be soothed with a cough suppressant. We have prescribed you a cough suppressant to be taken as  directed Benzonatate 100 mg every 8 hours as needed

## 2022-08-11 NOTE — Progress Notes (Signed)
    SUBJECTIVE:   CHIEF COMPLAINT / HPI:   Anxiety and depression Patient reports significant anxiety impacting his daily life. Chronic ongoing issue worsening over the past few years. States he sometimes starts hyperventilating and has to use techniques to calm himself down. Particularly in social situations. Per chart review, significant agoraphobia reported at office visit in 02/2022. States he tried therapy after that appointment but feels it did not work for him. Denies history of mania or hyperactivity and fhx Bipolar. Denies SI.  Foot lesion - tinea pedis vs plantar wart Persistent lesion on plantar surface of foot that recurred after he ran out of Ketoconazole cream in 03/2022. States the cream initially helped treat it. Denies pain.  PERTINENT  PMH / PSH: GERD,  OBJECTIVE:   BP 122/80   Pulse 98   Ht 5\' 5"  (1.651 m)   Wt 293 lb 6.4 oz (133.1 kg)   SpO2 96%   BMI 48.82 kg/m    General: NAD, pleasant, able to participate in exam Cardiac: RRR, no murmurs. Respiratory: CTAB, normal effort, No wheezes, rales or rhonchi Extremities: no edema or cyanosis. Skin: Warm and dry. Hyperpigmented, scaly lesion on plantar surface of L forefoot (see media tab) Neuro: alert, no obvious focal deficits Psych: Quiet speech. Cooperative. Denies SI.  ASSESSMENT/PLAN:   Skin lesion of foot Hyperpigmented, scaling lesion on plantar surface of L foot. Likely tinea pedis (given prior improvement on Ketoconazole cream) vs plantar warts. Recurrence after he ran out of fungal cream. -Start Ketoconazole cream BID given prior success and follow up in 4 weeks to assess -Consider Podiatry referral for large plantar wart if not improving  Snoring Reordered sleep study and provided contact information.  Anxiety GAD 18. Anxiety significantly impacting daily life. Trial Sertraline 25mg  x 4 weeks. Discussed risk/benefits of medication including side effects.  Depression, major, single episode, severe  (HCC) PHQ: 10. Denies SI. Denies h/o mania symptoms or Fhx Bipolar disorder. Trial SSRI as above.  Gastroesophageal reflux disease Endorses reflux symptoms when not taking medication. Refill Omeprazole 20mg  daily  Blood pressure elevated without history of HTN BP 122/80. Advised checking home BP reading 1-2 times per week. Sent in BP cuff for home use.     Dr. Colletta Maryland, [place]

## 2022-08-12 ENCOUNTER — Encounter: Payer: Self-pay | Admitting: Family Medicine

## 2022-08-12 ENCOUNTER — Other Ambulatory Visit: Payer: Self-pay | Admitting: Family Medicine

## 2022-08-12 ENCOUNTER — Ambulatory Visit: Payer: BC Managed Care – PPO | Admitting: Family Medicine

## 2022-08-12 VITALS — BP 122/80 | HR 98 | Ht 65.0 in | Wt 293.4 lb

## 2022-08-12 DIAGNOSIS — K219 Gastro-esophageal reflux disease without esophagitis: Secondary | ICD-10-CM

## 2022-08-12 DIAGNOSIS — F419 Anxiety disorder, unspecified: Secondary | ICD-10-CM

## 2022-08-12 DIAGNOSIS — R030 Elevated blood-pressure reading, without diagnosis of hypertension: Secondary | ICD-10-CM | POA: Diagnosis not present

## 2022-08-12 DIAGNOSIS — R0683 Snoring: Secondary | ICD-10-CM | POA: Diagnosis not present

## 2022-08-12 DIAGNOSIS — B353 Tinea pedis: Secondary | ICD-10-CM

## 2022-08-12 DIAGNOSIS — F322 Major depressive disorder, single episode, severe without psychotic features: Secondary | ICD-10-CM

## 2022-08-12 DIAGNOSIS — F411 Generalized anxiety disorder: Secondary | ICD-10-CM

## 2022-08-12 DIAGNOSIS — L989 Disorder of the skin and subcutaneous tissue, unspecified: Secondary | ICD-10-CM

## 2022-08-12 MED ORDER — OMEPRAZOLE 20 MG PO CPDR: 20.0000 mg | DELAYED_RELEASE_CAPSULE | Freq: Every day | ORAL | 0 refills | Status: AC

## 2022-08-12 MED ORDER — SERTRALINE HCL 25 MG PO TABS: 25.0000 mg | ORAL_TABLET | Freq: Every day | ORAL | 0 refills | Status: DC

## 2022-08-12 MED ORDER — KETOCONAZOLE 2 % EX CREA: 1.0000 | TOPICAL_CREAM | Freq: Two times a day (BID) | CUTANEOUS | 2 refills | Status: AC

## 2022-08-12 MED ORDER — BD ASSURE BPM/AUTO ARM CUFF MISC: 1.0000 [IU] | 0 refills | Status: AC

## 2022-08-12 NOTE — Assessment & Plan Note (Addendum)
PHQ: 10. Denies SI. Denies h/o mania symptoms or Fhx Bipolar disorder. Trial SSRI as above.

## 2022-08-12 NOTE — Assessment & Plan Note (Signed)
Reordered sleep study and provided contact information.

## 2022-08-12 NOTE — Assessment & Plan Note (Signed)
GAD 18. Anxiety significantly impacting daily life. Trial Sertraline '25mg'$  x 4 weeks. Discussed risk/benefits of medication including side effects.

## 2022-08-12 NOTE — Assessment & Plan Note (Signed)
Endorses reflux symptoms when not taking medication. Refill Omeprazole '20mg'$  daily

## 2022-08-12 NOTE — Assessment & Plan Note (Addendum)
Hyperpigmented, scaling lesion on plantar surface of L foot. Likely tinea pedis (given prior improvement on Ketoconazole cream) vs plantar warts. Recurrence after he ran out of fungal cream. -Start Ketoconazole cream BID given prior success and follow up in 4 weeks to assess -Consider Podiatry referral for large plantar wart if not improving

## 2022-08-12 NOTE — Assessment & Plan Note (Signed)
BP 122/80. Advised checking home BP reading 1-2 times per week. Sent in BP cuff for home use.

## 2022-08-12 NOTE — Patient Instructions (Addendum)
It was wonderful to see you today! Thank you for choosing [facility].   Please bring ALL of your medications with you to every visit.   Today we talked about:  We are starting Zoloft (Sertaline) for your depression and anxiety. It will take a couple weeks to see an effect from the medication but please continue taking it. It may cause stomach upset in the first day or two but that is usually very short lived.  I refilled the foot cream. It is possible this is not a fungal infection but please use the foot cream for the next month and we can follow up if it is not working I refilled your reflux medication and resent the order for your sleep study.  Please follow up in 1 months   If you haven't already, sign up for My Chart to have easy access to your labs results, and communication with your primary care physician.  Call the clinic at [phone] if your symptoms worsen or you have any concerns.  Please be sure to schedule follow up at the front desk before you leave today.   Colletta Maryland, DO Family Medicine     Blood Pressure Record Sheet To take your blood pressure, you will need a blood pressure machine. You can buy a blood pressure machine (blood pressure monitor) at your clinic, drug store, or online. When choosing one, consider: An automatic monitor that has an arm cuff. A cuff that wraps snugly around your upper arm. You should be able to fit only one finger between your arm and the cuff. A device that stores blood pressure reading results. Do not choose a monitor that measures your blood pressure from your wrist or finger. Follow your health care provider's instructions for how to take your blood pressure. To use this form: Take your blood pressure medications every day These measurements should be taken when you have been at rest for at least 10-15 min Take at least 2 readings with each blood pressure check. This makes sure the results are correct. Wait  1-2 minutes between measurements. Write down the results in the spaces on this form. Keep in mind it should always be recorded systolic over diastolic. Both numbers are important.  Repeat this every day for 2-3 weeks, or as told by your health care provider.  Make a follow-up appointment with your health care provider to discuss the results.  Blood Pressure Log Date Medications taken? (Y/N) Blood Pressure Time of Day

## 2022-09-27 ENCOUNTER — Ambulatory Visit (HOSPITAL_BASED_OUTPATIENT_CLINIC_OR_DEPARTMENT_OTHER): Payer: BC Managed Care – PPO | Attending: Family Medicine | Admitting: Internal Medicine

## 2022-09-27 VITALS — Ht 64.0 in | Wt 287.0 lb

## 2022-09-27 DIAGNOSIS — G4733 Obstructive sleep apnea (adult) (pediatric): Secondary | ICD-10-CM

## 2022-09-27 DIAGNOSIS — R0683 Snoring: Secondary | ICD-10-CM | POA: Diagnosis present

## 2022-10-02 DIAGNOSIS — R0683 Snoring: Secondary | ICD-10-CM

## 2022-10-02 NOTE — Procedures (Signed)
     Patient Name: Maurice Evans, Maurice Evans Date: 09/27/2022 Gender: Male D.O.B: 1990-07-17 Age (years): 32 Referring Provider: Janit Pagan Height (inches): 64 Interpreting Physician: Jetty Duhamel MD, ABSM Weight (lbs): 287 RPSGT: Rosette Reveal BMI: 49 MRN: [id number] Neck Size: 18.00  CLINICAL INFORMATION Sleep Study Type: NPSG Indication for sleep study: OSA Epworth Sleepiness Score: 18  SLEEP STUDY TECHNIQUE As per the AASM Manual for the Scoring of Sleep and Associated Events v2.3 (April 2016) with a hypopnea requiring 4% desaturations.  The channels recorded and monitored were frontal, central and occipital EEG, electrooculogram (EOG), submentalis EMG (chin), nasal and oral airflow, thoracic and abdominal wall motion, anterior tibialis EMG, snore microphone, electrocardiogram, and pulse oximetry.  MEDICATIONS Medications self-administered by patient taken the night of the study : none reported  SLEEP ARCHITECTURE The study was initiated at 11:24:38 PM and ended at 5:25:11 AM.  Sleep onset time was 84.4 minutes and the sleep efficiency was 74.7%. The total sleep time was 269.5 minutes.  Stage REM latency was 54.5 minutes.  The patient spent 7.6% of the night in stage N1 sleep, 54.2% in stage N2 sleep, 0.0% in stage N3 and 38.2% in REM.  Alpha intrusion was absent.  Supine sleep was 79.22%.  RESPIRATORY PARAMETERS The overall apnea/hypopnea index (AHI) was 44.5 per hour. There were 53 total apneas, including 53 obstructive, 0 central and 0 mixed apneas. There were 147 hypopneas and 7 RERAs.  The AHI during Stage REM sleep was 64.7 per hour.  AHI while supine was 50.0 per hour.  The mean oxygen saturation was 92.9%. The minimum SpO2 during sleep was 70.0%.  loud snoring was noted during this study.  CARDIAC DATA The 2 lead EKG demonstrated sinus rhythm. The mean heart rate was 64.0 beats per minute. Other EKG findings include: None.  LEG MOVEMENT DATA The  total PLMS were 0 with a resulting PLMS index of 0.0. Associated arousal with leg movement index was 0.0 .  IMPRESSIONS - Severe obstructive sleep apnea occurred during this study (AHI = 44.5/h). - Oxygen desaturation was noted during this study (Min O2 = 70.0%, Mean 92.9%). - The patient snored with loud snoring volume. - No cardiac abnormalities were noted during this study. - Clinically significant periodic limb movements did not occur during sleep. No significant associated arousals  DIAGNOSIS - Obstructive Sleep Apnea (G47.33)  RECOMMENDATIONS - Suggest CPAP titration sleep study or autopap. Other options would be based on cliinical judgment. - Avoid alcohol, sedatives and other CNS depressants that may worsen sleep apnea and disrupt normal sleep architecture. - Sleep hygiene should be reviewed to assess factors that may improve sleep quality. - Weight management and regular exercise should be initiated or continued if appropriate.  [Electronically signed] 10/02/2022 10:27 AM  Jetty Duhamel MD, ABSM Diplomate, American Board of Sleep Medicine NPI: [id number]                        Jetty Duhamel Diplomate, American Board of Sleep Medicine  ELECTRONICALLY SIGNED ON:  10/02/2022, 10:25 AM [facility] PH: [phone]   FX: [phone] ACCREDITED BY THE AMERICAN ACADEMY OF SLEEP MEDICINE

## 2022-10-05 NOTE — Addendum Note (Signed)
Addended by: Elberta Fortis on: 10/05/2022 04:40 PM   Modules accepted: Orders

## 2022-10-12 ENCOUNTER — Telehealth: Payer: Self-pay

## 2022-10-12 NOTE — Telephone Encounter (Signed)
Spoke with patient about sleep study results. Found severe OSA and ordered CPAP titration trial on 5/1. Patient has not yet been contacted to schedule. Recommended calling sleep center to schedule.  Patient states his anxiety has been pretty bad. Would like to adjust his medication. Has visit scheduled for 5/23 and will discuss at that time. Recommended considering FMLA given issues at work due to anxiety.  Elberta Fortis, DO

## 2022-10-12 NOTE — Telephone Encounter (Signed)
Patient calls nurse line requesting sleep study results.   Will forward to PCP.

## 2022-10-20 ENCOUNTER — Emergency Department (HOSPITAL_COMMUNITY)
Admission: EM | Admit: 2022-10-20 | Discharge: 2022-10-20 | Disposition: A | Discharge status: Home / Self Care | Payer: BC Managed Care – PPO | Attending: Emergency Medicine | Admitting: Emergency Medicine

## 2022-10-20 ENCOUNTER — Encounter (HOSPITAL_COMMUNITY): Payer: Self-pay | Admitting: *Deleted

## 2022-10-20 ENCOUNTER — Other Ambulatory Visit: Payer: Self-pay

## 2022-10-20 ENCOUNTER — Ambulatory Visit (HOSPITAL_COMMUNITY)
Admission: EM | Admit: 2022-10-20 | Discharge: 2022-10-20 | Disposition: A | Discharge status: Home / Self Care | Payer: BC Managed Care – PPO

## 2022-10-20 DIAGNOSIS — R1110 Vomiting, unspecified: Secondary | ICD-10-CM | POA: Diagnosis not present

## 2022-10-20 DIAGNOSIS — K5900 Constipation, unspecified: Secondary | ICD-10-CM

## 2022-10-20 DIAGNOSIS — R1012 Left upper quadrant pain: Secondary | ICD-10-CM | POA: Insufficient documentation

## 2022-10-20 DIAGNOSIS — R109 Unspecified abdominal pain: Secondary | ICD-10-CM

## 2022-10-20 HISTORY — DX: Sleep apnea, unspecified: G47.30

## 2022-10-20 LAB — CBC
HCT: 41.7 % (ref 39.0–52.0)
Hemoglobin: 13.4 g/dL (ref 13.0–17.0)
MCH: 29.6 pg (ref 26.0–34.0)
MCHC: 32.1 g/dL (ref 30.0–36.0)
MCV: 92.3 fL (ref 80.0–100.0)
Platelets: 268 10*3/uL (ref 150–400)
RBC: 4.52 MIL/uL (ref 4.22–5.81)
RDW: 13.3 % (ref 11.5–15.5)
WBC: 5.7 10*3/uL (ref 4.0–10.5)
nRBC: 0 % (ref 0.0–0.2)

## 2022-10-20 LAB — COMPREHENSIVE METABOLIC PANEL
ALT: 16 U/L (ref 0–44)
AST: 15 U/L (ref 15–41)
Albumin: 4.3 g/dL (ref 3.5–5.0)
Alkaline Phosphatase: 42 U/L (ref 38–126)
Anion gap: 13 (ref 5–15)
BUN: 11 mg/dL (ref 6–20)
CO2: 23 mmol/L (ref 22–32)
Calcium: 9.6 mg/dL (ref 8.9–10.3)
Chloride: 102 mmol/L (ref 98–111)
Creatinine, Ser: 0.85 mg/dL (ref 0.61–1.24)
GFR, Estimated: >60 mL/min (ref >60)
Glucose, Bld: 94 mg/dL (ref 70–99)
Potassium: 4.1 mmol/L (ref 3.5–5.1)
Sodium: 138 mmol/L (ref 135–145)
Total Bilirubin: 0.8 mg/dL (ref 0.3–1.2)
Total Protein: 7.2 g/dL (ref 6.5–8.1)

## 2022-10-20 LAB — URINALYSIS, ROUTINE W REFLEX MICROSCOPIC
Bilirubin Urine: NEGATIVE
Glucose, UA: NEGATIVE mg/dL
Hgb urine dipstick: NEGATIVE
Ketones, ur: 5 mg/dL — AB
Leukocytes,Ua: NEGATIVE
Nitrite: NEGATIVE
Protein, ur: NEGATIVE mg/dL
Specific Gravity, Urine: 1.018 (ref 1.005–1.030)
pH: 7 (ref 5.0–8.0)

## 2022-10-20 LAB — LIPASE, BLOOD: Lipase: 20 U/L (ref 11–51)

## 2022-10-20 MED ORDER — LIDOCAINE 5 % EX PTCH: 1.0000 | MEDICATED_PATCH | CUTANEOUS | 0 refills | Status: DC

## 2022-10-20 MED ORDER — LIDOCAINE 5 % EX PTCH
1.0000 | MEDICATED_PATCH | Freq: Once | CUTANEOUS | Status: DC
  Administered 2022-10-20: 1 via TRANSDERMAL
  Filled 2022-10-20: qty 1

## 2022-10-20 MED ORDER — IBUPROFEN 400 MG PO TABS
600.0000 mg | ORAL_TABLET | Freq: Once | ORAL | Status: AC
  Administered 2022-10-20: 600 mg via ORAL
  Filled 2022-10-20: qty 1

## 2022-10-20 MED ORDER — ACETAMINOPHEN 500 MG PO TABS
1000.0000 mg | ORAL_TABLET | Freq: Once | ORAL | Status: AC
  Administered 2022-10-20: 1000 mg via ORAL
  Filled 2022-10-20: qty 2

## 2022-10-20 NOTE — ED Notes (Signed)
Patient is being discharged from the Urgent Care and sent to the Emergency Department via POV . Per [place], [place], patient is in need of higher level of care due to abdominal pain. Patient is aware and verbalizes understanding of plan of care.  Vitals:   10/20/22 0951  BP: (!) 140/94  Pulse: 76  Resp: 18  Temp: 98.4 F (36.9 C)  SpO2: 96%

## 2022-10-20 NOTE — ED Provider Notes (Signed)
[facility]    CSN: [id number] Arrival date & time: 10/20/22  0849      History   Chief Complaint Chief Complaint  Patient presents with   Flank Pain   Abdominal Pain    HPI Maurice Evans is a 32 y.o. male.   Pleasant 32 year old male presents due to concerns of left flank pain.  He states his symptoms started on Sunday.  He reports the pain as intermittent in nature, but describes it as a sharp stabbing pain.  It is primarily to the left upper quadrant, but does intermittently go down to his left lower quadrant.  He is also having some slight flank pain.  He admits to drinking 48 ounces of alcoholic beverages consistently.  He states he has not had a good bowel movement in over a week, had 1 bowel movement 2 days ago, but states it was severe constipation and not much came out.  He reports being nauseated, but no vomiting.  He denies a fever.  He reports that laying flat causes severe pain in his abdomen, laying on the right seems to alleviate some of the pain.  He has not tried any over-the-counter medications for symptoms.   Flank Pain Associated symptoms include abdominal pain.  Abdominal Pain   Past Medical History:  Diagnosis Date   Anxiety    Asthma    possibly   Depression    Hypertension    Sleep apnea     Patient Active Problem List   Diagnosis Date Noted   Prediabetes 02/25/2022   Constipation 06/25/2021   Seasonal allergies 05/14/2021   Tobacco use 05/14/2021   Anxiety 03/22/2021   Skin lesion of foot 03/22/2021   Depression, major, single episode, severe (HCC) 07/06/2020   Blood pressure elevated without history of HTN 07/06/2020   Snoring 07/06/2020   Gastroesophageal reflux disease 07/06/2020    Past Surgical History:  Procedure Laterality Date   HERNIA REPAIR     MASTECTOMY FOR GYNECOMASTIA Bilateral 2005       Home Medications    Prior to Admission medications   Medication Sig Start Date End Date Taking? Authorizing  Provider  ketoconazole (NIZORAL) 2 % cream Apply 1 Application topically 2 (two) times daily. 08/12/22  Yes Elberta Fortis, MD  omeprazole (PRILOSEC) 20 MG capsule Take 1 capsule (20 mg total) by mouth daily. 08/12/22  Yes Elberta Fortis, MD  sertraline (ZOLOFT) 25 MG tablet Take 1 tablet (25 mg total) by mouth daily. 08/12/22  Yes Elberta Fortis, MD  Blood Pressure Monitoring (B-D ASSURE BPM/AUTO ARM CUFF) MISC 1 Units by Does not apply route every other day. 08/12/22   Elberta Fortis, MD    Family History Family History  Problem Relation Age of Onset   Hypertension Mother    Hyperlipidemia Mother    Diabetes Mother    Thyroid disease Father     Social History Social History   Tobacco Use   Smoking status: Never   Smokeless tobacco: Never  Vaping Use   Vaping Use: Never used  Substance Use Topics   Alcohol use: Yes    Comment: occasionally   Drug use: Yes    Frequency: 3.0 times per week    Types: Marijuana     Allergies   Sulfamethoxazole-trimethoprim   Review of Systems Review of Systems  Gastrointestinal:  Positive for abdominal pain.  Genitourinary:  Positive for flank pain.  As per HPI   Physical Exam Triage Vital Signs ED Triage Vitals  Enc Vitals Group     BP 10/20/22 0951 (!) 140/94     Pulse Rate 10/20/22 0951 76     Resp 10/20/22 0951 18     Temp 10/20/22 0951 98.4 F (36.9 C)     Temp Source 10/20/22 0951 Oral     SpO2 10/20/22 0951 96 %     Weight --      Height --      Head Circumference --      Peak Flow --      Pain Score 10/20/22 0949 7     Pain Loc --      Pain Edu? --      Excl. in GC? --    No data found.  Updated Vital Signs BP (!) 140/94 (BP Location: Left Arm)   Pulse 76   Temp 98.4 F (36.9 C) (Oral)   Resp 18   SpO2 96%   Visual Acuity Right Eye Distance:   Left Eye Distance:   Bilateral Distance:    Right Eye Near:   Left Eye Near:    Bilateral Near:     Physical Exam Vitals and nursing note reviewed.   Constitutional:      General: He is in acute distress.     Appearance: He is well-developed. He is obese. He is not ill-appearing, toxic-appearing or diaphoretic.  HENT:     Head: Normocephalic and atraumatic.  Eyes:     General: No scleral icterus. Cardiovascular:     Rate and Rhythm: Normal rate.  Pulmonary:     Effort: Pulmonary effort is normal. No respiratory distress.  Abdominal:     General: Bowel sounds are normal. There is distension.     Palpations: Abdomen is soft.     Tenderness: There is abdominal tenderness (severe LUQ pain to minimal palpation; sx much more severe when laying supine) in the left upper quadrant. There is left CVA tenderness and rebound. There is no right CVA tenderness or guarding.  Skin:    General: Skin is warm.     Coloration: Skin is not jaundiced.     Findings: No rash.  Neurological:     General: No focal deficit present.     Mental Status: He is alert and oriented to person, place, and time.      UC Treatments / Results  Labs (all labs ordered are listed, but only abnormal results are displayed) Labs Reviewed - No data to display  EKG   Radiology No results found.  Procedures Procedures (including critical care time)  Medications Ordered in UC Medications - No data to display  Initial Impression / Assessment and Plan / UC Course  I have reviewed the triage vital signs and the nursing notes.  Pertinent labs & imaging results that were available during my care of the patient were reviewed by me and considered in my medical decision making (see chart for details).     Acute LUQ pain -32 year old male with acute left upper quadrant pain for the past several days.  He was exquisitely tender to palpation, also having CVA tenderness.  In the setting of excessive alcohol intake, I have significant concern for acute pancreatitis.  I feel that further workup in the emergency room with labs, IV hydration, and CT scan of the abdomen and  pelvis is necessary. Constipation -this will further be evaluated in the emergency room.  Final Clinical Impressions(s) / UC Diagnoses   Final diagnoses:  Acute LUQ pain  Constipation, unspecified constipation  type     Discharge Instructions      I have concern for acute pancreatitis. This needs to be further evaluated in the Emergency Room. Please head to the ER now for a further workup     ED Prescriptions   None    PDMP not reviewed this encounter.   Maretta Bees, [place] 10/20/22 1022

## 2022-10-20 NOTE — ED Notes (Signed)
Patient is being discharged from the Urgent Care and sent to the Emergency Department via POV . Per Guy Sandifer, PA, patient is in need of higher level of care due to higher level of care needed. Patient is aware and verbalizes understanding of plan of care.  Vitals:   10/20/22 0951  BP: (!) 140/94  Pulse: 76  Resp: 18  Temp: 98.4 F (36.9 C)  SpO2: 96%

## 2022-10-20 NOTE — Discharge Instructions (Signed)
I have concern for acute pancreatitis. This needs to be further evaluated in the Emergency Room. Please head to the ER now for a further workup

## 2022-10-20 NOTE — Discharge Instructions (Addendum)
You were seen in the emergency department for your flank pain.  Your workup showed no obvious signs of kidney stone, infection, pancreatitis for causes of your pain and you likely pulled your muscles on your left side.  You can continue to take Tylenol and Motrin as needed for pain and both can take every 6 hours as well as using lidocaine patches, ice or heat.  You can follow-up with your primary doctor next week as planned to have your symptoms rechecked.  You should return to the emergency department if you have fevers, repetitive vomiting, significantly worsening pain or any other new or concerning symptoms.

## 2022-10-20 NOTE — ED Triage Notes (Signed)
Pt was seen at [facility] for abdominal pain which began Sunday and is on LLQ.  [facility] sent him here for r/o pancreatitis.  No labs or urine done at [facility]

## 2022-10-20 NOTE — ED Provider Notes (Signed)
[place] EMERGENCY DEPARTMENT AT [facility] Provider Note   CSN: [id number] Arrival date & time: 10/20/22  1037     History  Chief Complaint  Patient presents with   Abdominal Pain    Maurice Evans is a 32 y.o. male.  Is a 32 year old male with no significant past medical history presenting to the emergency department with left-sided flank pain.  He states that he has had pain that wraps around his left upper quadrant to his left back since Monday.  He states that he had 1 episode of nausea and vomiting but has had no further nausea since.  He denies any trauma or falls but states that he does work in [employer] and does do repetitive activities.  He denies any diarrhea or constipation, dysuria or hematuria.  He was seen at urgent care today and was recommended to come to the ER for further evaluation.  The history is provided by the patient.  Abdominal Pain      Home Medications Prior to Admission medications   Medication Sig Start Date End Date Taking? Authorizing Provider  lidocaine (LIDODERM) 5 % Place 1 patch onto the skin daily. Remove & Discard patch within 12 hours or as directed by MD 10/20/22  Yes Theresia Lo, Cecile Sheerer, DO  Blood Pressure Monitoring (B-D ASSURE BPM/AUTO ARM CUFF) MISC 1 Units by Does not apply route every other day. 08/12/22   Elberta Fortis, MD  ketoconazole (NIZORAL) 2 % cream Apply 1 Application topically 2 (two) times daily. 08/12/22   Elberta Fortis, MD  omeprazole (PRILOSEC) 20 MG capsule Take 1 capsule (20 mg total) by mouth daily. 08/12/22   Elberta Fortis, MD  sertraline (ZOLOFT) 25 MG tablet Take 1 tablet (25 mg total) by mouth daily. 08/12/22   Elberta Fortis, MD      Allergies    Sulfamethoxazole-trimethoprim    Review of Systems   Review of Systems  Gastrointestinal:  Positive for abdominal pain.    Physical Exam Updated Vital Signs BP (!) 147/100 (BP Location: Right Arm)   Pulse 75   Temp 98.4 F (36.9  C) (Oral)   Resp 18   SpO2 96%  Physical Exam Vitals and nursing note reviewed.  Constitutional:      General: He is not in acute distress.    Appearance: He is well-developed. He is obese.  HENT:     Head: Normocephalic and atraumatic.     Mouth/Throat:     Mouth: Mucous membranes are moist.  Eyes:     Extraocular Movements: Extraocular movements intact.  Cardiovascular:     Rate and Rhythm: Normal rate and regular rhythm.     Heart sounds: Normal heart sounds.  Pulmonary:     Effort: Pulmonary effort is normal.     Breath sounds: Normal breath sounds.  Abdominal:     General: Abdomen is flat.     Palpations: Abdomen is soft.     Tenderness: There is abdominal tenderness (Left flank). There is no right CVA tenderness or left CVA tenderness.  Skin:    General: Skin is warm and dry.     Findings: No rash.  Neurological:     General: No focal deficit present.     Mental Status: He is alert and oriented to person, place, and time.  Psychiatric:        Mood and Affect: Mood normal.        Behavior: Behavior normal.     ED Results / Procedures /  Treatments   Labs (all labs ordered are listed, but only abnormal results are displayed) Labs Reviewed  URINALYSIS, ROUTINE W REFLEX MICROSCOPIC - Abnormal; Notable for the following components:      Result Value   Ketones, ur 5 (*)    All other components within normal limits  LIPASE, BLOOD  COMPREHENSIVE METABOLIC PANEL  CBC    EKG None  Radiology No results found.  Procedures Procedures    Medications Ordered in ED Medications  lidocaine (LIDODERM) 5 % 1-3 patch (1 patch Transdermal Patch Applied 10/20/22 1512)  ibuprofen (ADVIL) tablet 600 mg (600 mg Oral Given 10/20/22 1512)  acetaminophen (TYLENOL) tablet 1,000 mg (1,000 mg Oral Given 10/20/22 1511)    ED Course/ Medical Decision Making/ A&P Clinical Course as of 10/20/22 1536  Thu Oct 20, 2022  1536 Upon reassessment, patient's urine is negative for bladder  infection.  His pain has improved.  He is stable for discharge with outpatient follow-up and was given strict return precautions. [VK]    Clinical Course User Index [VK] Rexford Maus, DO                             Medical Decision Making This patient presents to the ED with chief complaint(s) of flank pain with no pertinent past medical history which further complicates the presenting complaint. The complaint involves an extensive differential diagnosis and also carries with it a high risk of complications and morbidity.    The differential diagnosis includes pancreatitis, hepatitis, pyelonephritis, nephrolithiasis, no rashes making shingles unlikely, muscle strain or spasm  Additional history obtained: Additional history obtained from N/A Records reviewed urgent care records  ED Course and Reassessment: On patient's arrival to the emergency department he was initially evaluated by triage and had labs performed including LFTs and lipase that were within normal range.  He was comfortable appearing on my evaluation with reproducible tenderness to his left flank.  He was treated with Tylenol, Motrin and lidocaine patch.  Urine will be performed to evaluate for UTI or hematuria for possible stone.  Independent labs interpretation:  The following labs were independently interpreted: Within normal range  Independent visualization of imaging: -N/A  Consultation: - Consulted or discussed management/test interpretation w/ external professional: N/A  Consideration for admission or further workup: Patient has no emergent conditions requiring admission or further work-up at this time and is stable for discharge home with primary care follow-up  Social Determinants of health: N/A    Amount and/or Complexity of Data Reviewed Labs: ordered.  Risk OTC drugs. Prescription drug management.          Final Clinical Impression(s) / ED Diagnoses Final diagnoses:  Left flank pain     Rx / DC Orders ED Discharge Orders          Ordered    lidocaine (LIDODERM) 5 %  Every 24 hours        10/20/22 1532              Rexford Maus, DO 10/20/22 1536

## 2022-10-20 NOTE — ED Triage Notes (Signed)
Pt states he has left sided flank pain that comes around to his abdomin since Sunday. He states the pain is sharp and she thought he was constipated since he hasn't had a BM since last week sometimes. He has taken OTC meds for constipation.

## 2022-10-23 NOTE — Progress Notes (Signed)
    SUBJECTIVE:   CHIEF COMPLAINT / HPI:   Anxiety Patient reports the Sertraline is helping his anxiety some but he is still having significant difficulties at work. States management has been putting pressure on him and he feels he cannot attend work some days due to the anxiety. Also have difficulty sleeping at night due to worry about his OSA. States he worries he will stop breathing.  OSA Patient diagnosed with severe OSA per sleep study. Has not been able to schedule CPAP titration study.  Tinea pedis Infection significantly improved with Ketoconazole cream. Minor infection left and states he is still using the cream for some itching  PERTINENT  PMH / PSH: Severe OSA, GAD, GERD  OBJECTIVE:   BP 128/78   Pulse 79   Ht 5\' 4"  (1.626 m)   Wt 288 lb 3.2 oz (130.7 kg)   SpO2 95%   BMI 49.47 kg/m    General: Alert, no apparent distress, well groomed HEENT: Normocephalic, atraumatic, moist mucus membranes, neck supple Respiratory: Normal respiratory effort GI: Non-distended Skin: Warm, dry. 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 Improvement from starting Sertraline 25mg  (originally GAD 16). Continues to report difficult at work due to anxiety and difficulty sleeping at night. Not interested in counseling. Considering FMLA paperwork, may bring in at a later date. -Increase to Sertraline 50mg . Advised patient to call in 1 month if having persistent symptoms and can titrate dose up. -Hydroxyzine 10mg  qhs prn -Provided information for CBT-I app to help with insomnia  Depression, major, single episode, severe (HCC) PHQ: 6, improved since starting Sertraline as above (originally PHQ: 10). Denies SI. Denies euphoria, decreased sleep or other mania symptoms.  OSA (obstructive sleep apnea) Severe OSA on sleep study. Pending CPAP  titration study, patient unsure how to schedule. Provided information to schedule and advised to schedule soon. Will order CPAP when titration study complete.  Tinea pedis Significantly improved with Ketoconazole cream. Minor infection remaining, advised to continue use of cream until resolution to prevent recurrence.  Cerumen impaction Bilateral dark cerumen impaction. Irrigation performed by RN with success. Advised using Debrox for remaining cerumen and wax maintenance.    Dr. Elberta Fortis, DO [facility]

## 2022-10-27 ENCOUNTER — Ambulatory Visit (INDEPENDENT_AMBULATORY_CARE_PROVIDER_SITE_OTHER): Payer: BC Managed Care – PPO | Admitting: Family Medicine

## 2022-10-27 ENCOUNTER — Encounter: Payer: Self-pay | Admitting: Family Medicine

## 2022-10-27 VITALS — BP 128/78 | HR 79 | Ht 64.0 in | Wt 288.2 lb

## 2022-10-27 DIAGNOSIS — H6123 Impacted cerumen, bilateral: Secondary | ICD-10-CM | POA: Diagnosis not present

## 2022-10-27 DIAGNOSIS — S39012S Strain of muscle, fascia and tendon of lower back, sequela: Secondary | ICD-10-CM

## 2022-10-27 DIAGNOSIS — F411 Generalized anxiety disorder: Secondary | ICD-10-CM | POA: Diagnosis not present

## 2022-10-27 DIAGNOSIS — G4733 Obstructive sleep apnea (adult) (pediatric): Secondary | ICD-10-CM | POA: Diagnosis not present

## 2022-10-27 DIAGNOSIS — F419 Anxiety disorder, unspecified: Secondary | ICD-10-CM | POA: Diagnosis not present

## 2022-10-27 DIAGNOSIS — H6120 Impacted cerumen, unspecified ear: Secondary | ICD-10-CM | POA: Insufficient documentation

## 2022-10-27 DIAGNOSIS — B353 Tinea pedis: Secondary | ICD-10-CM

## 2022-10-27 DIAGNOSIS — F322 Major depressive disorder, single episode, severe without psychotic features: Secondary | ICD-10-CM

## 2022-10-27 MED ORDER — SERTRALINE HCL 50 MG PO TABS
50.0000 mg | ORAL_TABLET | Freq: Every day | ORAL | 2 refills | Status: DC
Start: 2022-10-27 — End: 2023-02-01

## 2022-10-27 MED ORDER — HYDROXYZINE HCL 10 MG PO TABS
10.0000 mg | ORAL_TABLET | Freq: Every evening | ORAL | 0 refills | Status: AC | PRN
Start: 2022-10-27 — End: ?

## 2022-10-27 MED ORDER — LIDOCAINE 5 % EX PTCH: 1.0000 | MEDICATED_PATCH | CUTANEOUS | 0 refills | Status: DC

## 2022-10-27 NOTE — Assessment & Plan Note (Addendum)
Significantly improved with Ketoconazole cream. Minor infection remaining, advised to continue use of cream until resolution to prevent recurrence.

## 2022-10-27 NOTE — Assessment & Plan Note (Signed)
Severe OSA on sleep study. Pending CPAP titration study, patient unsure how to schedule. Provided information to schedule and advised to schedule soon. Will order CPAP when titration study complete.

## 2022-10-27 NOTE — Assessment & Plan Note (Addendum)
PHQ: 6, improved since starting Sertraline as above (originally PHQ: 10). Denies SI. Denies euphoria, decreased sleep or other mania symptoms.

## 2022-10-27 NOTE — Assessment & Plan Note (Addendum)
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 Improvement from starting Sertraline 25mg  (originally GAD 16). Continues to report difficult at work due to anxiety and difficulty sleeping at night. Not interested in counseling. Considering FMLA paperwork, may bring in at a later date. -Increase to Sertraline 50mg . Advised patient to call in 1 month if having persistent symptoms and can titrate dose up. -Hydroxyzine 10mg  qhs prn -Provided information for CBT-I app to help with insomnia

## 2022-10-27 NOTE — Assessment & Plan Note (Signed)
Bilateral dark cerumen impaction. Irrigation performed by RN with success. Advised using Debrox for remaining cerumen and wax maintenance.

## 2022-10-27 NOTE — Patient Instructions (Addendum)
It was wonderful to see you today! Thank you for choosing [facility].   Please bring ALL of your medications with you to every visit.   Today we talked about:  We increased your Sertraline and added another medication called Hydroxyzine to be used at night for anxiety. Please use it as needed for difficulty sleeping. There is also an app called CBT-I Coach that can help with insomnia Please call to schedule your CPAP titration study. We need your study results to order your CPAP machine so we need to have that done first. Please try to use Debrox to continue to clear out the ear wax in your ears. You can continue to use the drops to loosen the wax and massage your ear in the shower.  Please follow up in 1 month for anxiety symptoms  Call the clinic at [phone] if your symptoms worsen or you have any concerns.  Please be sure to schedule follow up at the front desk before you leave today.   Elberta Fortis, DO Family Medicine

## 2022-10-28 ENCOUNTER — Telehealth: Payer: Self-pay | Admitting: Internal Medicine

## 2022-10-28 ENCOUNTER — Telehealth: Payer: Self-pay

## 2022-10-28 NOTE — Telephone Encounter (Signed)
Pt calling saying he needed to make a appt w/Dr. Maple Hudson again before his other Dr. Stann Mainland "...put in the order for the CPAP he needs."  I was thinking he needed FU after 30-90 days of CPAP usage but no, he said, he has to see Dr. Jeannie Fend again or the insurance won't cover him.  Chantel was behind me and she said to send to Triage. She did not know why he would need another appt with Dr. Maple Hudson when study is done/  Pls call @ [phone]

## 2022-10-28 NOTE — Telephone Encounter (Signed)
For more (or less) clarity see the last AVS notes with his Dr. Jayme Cloud from yesterday. She said she will order the CPAP once his in lab study is done but he has already had an in lab study last month.   In short, does he need to see Dr. Jeannie Fend again before he starts his CPAP or not. His # is [id number]

## 2022-10-28 NOTE — Telephone Encounter (Signed)
A Prior Authorization was initiated for this patients LIDOCAINE 5% PATCHES through CoverMyMeds.   Key: [id number]

## 2022-10-28 NOTE — Telephone Encounter (Signed)
Pt has only had the sleep study performed. Pt has not had an official consultation with our office yet so pt will need to have a consult with the office prior to [place] being able to place an order for cpap start.

## 2022-10-30 NOTE — Telephone Encounter (Signed)
Called Pt. LVMTCB and sched for a consult with a sleep dr.

## 2022-10-31 NOTE — Telephone Encounter (Signed)
Prior Auth for patients medication LIDOCAINE PATCHES denied by [facility] via CoverMyMeds.   Reason:  LIDOCAINE 4% PATCHES ARE AVAILABLE OTC  CoverMyMeds Key: [id number] PA Case ID: [id number]

## 2022-11-07 NOTE — Telephone Encounter (Signed)
Sent MYCHART message

## 2023-02-01 ENCOUNTER — Ambulatory Visit (INDEPENDENT_AMBULATORY_CARE_PROVIDER_SITE_OTHER): Payer: BC Managed Care – PPO | Admitting: Family Medicine

## 2023-02-01 VITALS — BP 136/86 | HR 77 | Temp 98.6°F | Ht 64.0 in | Wt 284.6 lb

## 2023-02-01 DIAGNOSIS — R062 Wheezing: Secondary | ICD-10-CM

## 2023-02-01 DIAGNOSIS — J029 Acute pharyngitis, unspecified: Secondary | ICD-10-CM | POA: Diagnosis not present

## 2023-02-01 DIAGNOSIS — G4733 Obstructive sleep apnea (adult) (pediatric): Secondary | ICD-10-CM | POA: Diagnosis not present

## 2023-02-01 DIAGNOSIS — F411 Generalized anxiety disorder: Secondary | ICD-10-CM

## 2023-02-01 DIAGNOSIS — F322 Major depressive disorder, single episode, severe without psychotic features: Secondary | ICD-10-CM

## 2023-02-01 LAB — POCT RAPID STREP A (OFFICE): Rapid Strep A Screen: NEGATIVE

## 2023-02-01 LAB — POC SOFIA SARS ANTIGEN FIA: SARS Coronavirus 2 Ag: NEGATIVE

## 2023-02-01 MED ORDER — ALBUTEROL SULFATE HFA 108 (90 BASE) MCG/ACT IN AERS
2.0000 | INHALATION_SPRAY | Freq: Four times a day (QID) | RESPIRATORY_TRACT | 2 refills | Status: AC | PRN
Start: 2023-02-01 — End: ?

## 2023-02-01 MED ORDER — SERTRALINE HCL 100 MG PO TABS
100.0000 mg | ORAL_TABLET | Freq: Every day | ORAL | 3 refills | Status: AC
Start: 2023-02-01 — End: ?

## 2023-02-01 NOTE — Progress Notes (Signed)
    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 Having lost of taste and smell.  Cough and congestion with yellow-green drainage. Unable to attend work at a school the last couple days due to feeling ill. Denies fever or change in appetite. Using over-the-counter cold and flu medication that did not help.  Depression, anxiety Patient reports his mood has worsened recently. Taking sertraline 50 mg daily states he is only get around 10% benefit from it. Declines counseling support. Denies SI and states he will have passive thoughts of hurting himself. Reports sometimes he feels hopeless and isolates himself from family.  Would like to discuss additional medication options.  PERTINENT  PMH / PSH: GAD, MDD, OSA  OBJECTIVE:   BP 136/86   Pulse 77   Temp 98.6 F (37 C)   Ht 5\' 4"  (1.626 m)   Wt 284 lb 9.6 oz (129.1 kg)   SpO2 100%   BMI 48.85 kg/m    General: NAD, pleasant, able to participate in exam HEENT: Congestion noted. Right TM erythematous but nonbulging.  Mild tonsillar enlargement.  No tonsillar exudates noted. Cardiac: RRR, no murmurs. Respiratory: Normal effort on room air.  Mild expiratory wheezing in multiple lung fields. Extremities: no edema or cyanosis. Skin: warm and dry, no rashes noted Neuro: alert, no obvious focal deficits Psych: Normal affect and mood  COVID test: Negative Strep test: Negative  ASSESSMENT/PLAN:   OSA (obstructive sleep apnea) CPAP titration study scheduled for 02/2023. Advised patient to follow-up if additional concerns after that time.  Depression, major, single episode, severe (HCC) PHQ: 23. Denies SI. Mood significantly worsened from prior visit. Taking sertraline 50 mg daily states symptoms have only improved about 10%. -Increase to sertraline 100 mg daily -Virtual check-in in approximately 3 to 4 weeks. Will consider adding  Bupropion at that time if patient is tolerating medication increase well  GAD (generalized anxiety disorder) GAD: 21. Significantly worsened from prior visit. Management as above  Sore throat COVID and strep testing negative. Most likely viral URI. H/o intermittent asthma, not currently on therapy. Mild wheezing upon exam, would benefit from inhaler. -Encourage supportive care with Mucinex, Tylenol and honey with warm fluids. -Albuterol inhaler as needed wheezing -Provided work note to allow for recovery   Dr. Elberta Fortis, DO [facility]

## 2023-02-01 NOTE — Assessment & Plan Note (Addendum)
PHQ: 23. Denies SI. Mood significantly worsened from prior visit. Taking sertraline 50 mg daily states symptoms have only improved about 10%. -Increase to sertraline 100 mg daily -Virtual check-in in approximately 3 to 4 weeks. Will consider adding Bupropion at that time if patient is tolerating medication increase well

## 2023-02-01 NOTE — Assessment & Plan Note (Signed)
GAD: 21. Significantly worsened from prior visit. Management as above

## 2023-02-01 NOTE — Patient Instructions (Addendum)
It was wonderful to see you today! Thank you for choosing [facility].   Please bring ALL of your medications with you to every visit.   Today we talked about:  For your mood, I am increasing your Sertraline to 100mg  daily. Please take two pill of whatever you have left and I sent in the new prescription to your pharmacy. Please continue to take the Hydroxyzine as needed at night to help you sleep. We tested you for Strep throat and COVID today. You are negative for strep throat and COVID. I would recommend using Mucinex for the congestion (you can get it cheaper over the counter). For your sore throat you can use honey, Tylenol and warm fluids. Please use the inhaler as needed for wheezing and shortness of breath.  Please follow up in 3-4 weeks for mood check via virtual visit  Call the clinic at [phone] if your symptoms worsen or you have any concerns.  Please be sure to schedule follow up at the front desk before you leave today.   Elberta Fortis, DO Family Medicine

## 2023-02-01 NOTE — Assessment & Plan Note (Signed)
CPAP titration study scheduled for 02/2023. Advised patient to follow-up if additional concerns after that time.

## 2023-02-01 NOTE — Assessment & Plan Note (Signed)
COVID and strep testing negative. Most likely viral URI. H/o intermittent asthma, not currently on therapy. Mild wheezing upon exam, would benefit from inhaler. -Encourage supportive care with Mucinex, Tylenol and honey with warm fluids. -Albuterol inhaler as needed wheezing -Provided work note to allow for recovery

## 2023-02-16 ENCOUNTER — Institutional Professional Consult (permissible substitution): Payer: BC Managed Care – PPO | Admitting: Adult Health

## 2023-02-20 ENCOUNTER — Encounter: Payer: Self-pay | Admitting: Adult Health

## 2023-07-09 NOTE — Progress Notes (Signed)
 07/11/23- 32 yoM for sleep evaluation with concern of OSA, complicated by anxiety, fear of crowds, NPSG 09/29/22- AHI 44.5/hr, desat to 70%, body weight 287 lbs Epworth score-19 Body weight today-289 lbs Discussed the use of AI scribe software for clinical note transcription with the patient, who gave verbal consent to proceed.  History of Present Illness   The patient, with a family history of sleep apnea, presents with daytime sleepiness and snoring. He reports falling asleep throughout the day, especially when sitting quietly. He denies using any sleep aids or caffeine to stay awake during the day. He also denies having any surgeries in his nose or throat. 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 He reports waking up choking and gasping for air during sleep.     Prior to Admission medications   Medication Sig Start Date End Date Taking? Authorizing Provider  albuterol  (VENTOLIN  HFA) 108 (90 Base) MCG/ACT inhaler Inhale 2 puffs into the lungs every 6 (six) hours as needed for wheezing or shortness of breath. 02/01/23  Yes Theophilus Pagan, MD  hydrOXYzine  (ATARAX ) 10 MG tablet Take 1 tablet (10 mg total) by mouth at bedtime as needed. 10/27/22  Yes Theophilus Pagan, MD  ketoconazole  (NIZORAL ) 2 % cream Apply 1 Application topically 2 (two) times daily. 08/12/22  Yes Theophilus Pagan, MD  omeprazole  (PRILOSEC) 20 MG capsule Take 1 capsule (20 mg total) by mouth daily. 08/12/22  Yes Theophilus Pagan, MD  sertraline  (ZOLOFT ) 100 MG tablet Take 1 tablet (100 mg total) by mouth daily. 02/01/23  Yes Theophilus Pagan, MD  Blood Pressure Monitoring (B-D ASSURE BPM/AUTO ARM CUFF) MISC 1 Units by Does not apply route every other day. Patient not taking: Reported on 07/11/2023 08/12/22   Theophilus Pagan, MD   Past Medical History:  Diagnosis Date   Anxiety    Asthma    possibly   Depression    Hypertension    Sleep apnea    Past Surgical History:  Procedure Laterality Date    HERNIA REPAIR     MASTECTOMY FOR GYNECOMASTIA Bilateral 2005   Family History  Problem Relation Age of Onset   Hypertension Mother    Hyperlipidemia Mother    Diabetes Mother    Thyroid disease Father    Social History   Socioeconomic History   Marital status: Single    Spouse name: Not on file   Number of children: Not on file   Years of education: Not on file   Highest education level: Not on file  Occupational History   Occupation: Janitorial    Employer: [employer]  Tobacco Use   Smoking status: Former    Types: Cigarettes   Smokeless tobacco: Never   Tobacco comments:    Stopped smoking 1 year ago.  At the most smoked 2-3/day.  07/11/23 hfb  Vaping Use   Vaping status: Never Used  Substance and Sexual Activity   Alcohol use: Yes    Comment: occasionally   Drug use: Yes    Frequency: 3.0 times per week    Types: Marijuana   Sexual activity: Yes    Comment: Signifiant other on birth control  Other Topics Concern   Not on file  Social History Narrative   Not on file   Social Drivers of Health   Financial Resource Strain: Not on file  Food Insecurity: No Food Insecurity (04/01/2021)   Hunger Vital Sign    Worried About Running Out of Food in  the Last Year: Never true    Ran Out of Food in the Last Year: Never true  Transportation Needs: No Transportation Needs (04/01/2021)   PRAPARE - Administrator, Civil Service (Medical): No    Lack of Transportation (Non-Medical): No  Physical Activity: Not on file  Stress: Stress Concern Present (04/01/2021)   [employer] of Occupational Health - Occupational Stress Questionnaire    Feeling of Stress : Very much  Social Connections: Not on file  Intimate Partner Violence: Not on file   ROS-see HPI   + = positive Constitutional:    weight loss, night sweats, fevers, chills, fatigue, lassitude. HEENT:    headaches, difficulty swallowing, tooth/dental problems, sore throat,       sneezing,  itching, ear ache, nasal congestion, post nasal drip, snoring CV:    chest pain, orthopnea, PND, swelling in lower extremities, anasarca,                                   dizziness, palpitations Resp:   shortness of breath with exertion or at rest.                productive cough,   non-productive cough, coughing up of blood.              change in color of mucus.  wheezing.   Skin:    rash or lesions. GI: +heartburn, indigestion, abdominal pain, nausea, vomiting, diarrhea,                 change in bowel habits, loss of appetite GU: dysuria, change in color of urine, no urgency or frequency.   flank pain. MS:   joint pain, stiffness, decreased range of motion, back pain. Neuro-     nothing unusual Psych:  change in mood or affect.  depression or +anxiety.   memory loss.  OBJ- Physical Exam General- Alert, Oriented, Affect-appropriate, Distress- none acute Skin- rash-none, lesions- none, excoriation- none Lymphadenopathy- none Head- atraumatic            Eyes- Gross vision intact, PERRLA, conjunctivae and secretions clear, +dark circles under eyes            Ears- Hearing, canals-normal            Nose- Clear, no-Septal dev, mucus, polyps, erosion, perforation             Throat- Mallampati IV , mucosa clear , drainage- none, tonsils+present, +teeth Neck- flexible , trachea midline, no stridor , thyroid nl, carotid no bruit Chest - symmetrical excursion , unlabored           Heart/CV- RRR , no murmur , no gallop  , no rub, nl s1 s2                           - JVD- none , edema- none, stasis changes- none, varices- none           Lung- clear to P&A, wheeze- none, cough- none , dullness-none, rub- none           Chest wall-  Abd-  Br/ Gen/ Rectal- Not done, not indicated Extrem- cyanosis- none, clubbing, none, atrophy- none, strength- nl Neuro- grossly intact to observation Assessment and Plan    Obstructive Sleep Apnea Severe sleep apnea diagnosed by sleep study with symptoms of  daytime sleepiness and snoring. No current  treatment. Discussed the pathophysiology of sleep apnea and the risks associated with untreated sleep apnea including cardiovascular disease, impaired cognition, and increased risk of accidents. -Initiate CPAP therapy with a pressure range determined by the physician and mask of choice. -Home care company to contact patient within a week to set up CPAP machine. -Follow-up in three months to assess CPAP usage and effectiveness.   Obesity -Encourage weight loss

## 2023-07-11 ENCOUNTER — Ambulatory Visit (INDEPENDENT_AMBULATORY_CARE_PROVIDER_SITE_OTHER): Payer: 59 | Admitting: Internal Medicine

## 2023-07-11 ENCOUNTER — Encounter: Payer: Self-pay | Admitting: Internal Medicine

## 2023-07-11 VITALS — BP 110/70 | HR 82 | Temp 98.2°F | Ht 60.0 in | Wt 289.0 lb

## 2023-07-11 DIAGNOSIS — Z23 Encounter for immunization: Secondary | ICD-10-CM | POA: Diagnosis not present

## 2023-07-11 DIAGNOSIS — G4733 Obstructive sleep apnea (adult) (pediatric): Secondary | ICD-10-CM | POA: Diagnosis not present

## 2023-07-11 NOTE — Patient Instructions (Signed)
Ordered- new DME, new CPAP auto 5-20, mask of choice, humidifier, supplies, AirView/ card  Please call if we can help

## 2023-10-02 ENCOUNTER — Ambulatory Visit: Admitting: Family Medicine

## 2023-10-02 NOTE — Progress Notes (Deleted)
    SUBJECTIVE:   CHIEF COMPLAINT / HPI:   Weight loss   OSA Seen by pulmonology on 07/11/2023 diagnosed with severe OSA.  Initiated on CPAP and recommended weight loss.  MDD, GAD Increased to sertraline  100 mg daily on 02/01/2023.  *Needs GAD-7  PERTINENT  PMH / PSH: ***  OBJECTIVE:   There were no vitals taken for this visit. ***  General: NAD, pleasant, able to participate in exam Cardiac: RRR, no murmurs. Respiratory: CTAB, normal effort, No wheezes, rales or rhonchi Abdomen: Bowel sounds present, nontender, nondistended Extremities: no edema or cyanosis. Skin: warm and dry, no rashes noted Neuro: alert, no obvious focal deficits Psych: Normal affect and mood  ASSESSMENT/PLAN:   No problem-specific Assessment & Plan notes found for this encounter.     Dr. Jonne Netters, DO [facility]    {    This will disappear when note is signed, click to select method of visit    :1}

## 2023-10-07 NOTE — Progress Notes (Deleted)
 07/11/23- 32 yoM for sleep evaluation with concern of OSA, complicated by anxiety, fear of crowds, NPSG 09/29/22- AHI 44.5/hr, desat to 70%, body weight 287 lbs Epworth score-19 Body weight today-289 lbs Discussed the use of AI scribe software for clinical note transcription with the patient, who gave verbal consent to proceed.  History of Present Illness   The patient, with a family history of sleep apnea, presents with daytime sleepiness and snoring. He reports falling asleep throughout the day, especially when sitting quietly. He denies using any sleep aids or caffeine to stay awake during the day. He also denies having any surgeries in his nose or throat. 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 He reports waking up choking and gasping for air during sleep.     Assessment Plan:    Obstructive Sleep Apnea Severe sleep apnea diagnosed by sleep study with symptoms of daytime sleepiness and snoring. No current treatment. Discussed the pathophysiology of sleep apnea and the risks associated with untreated sleep apnea including cardiovascular disease, impaired cognition, and increased risk of accidents. -Initiate CPAP therapy with a pressure range determined by the physician and mask of choice. -Home care company to contact patient within a week to set up CPAP machine. -Follow-up in three months to assess CPAP usage and effectiveness.  Obesity -Encourage weight loss   10/09/23- 33 yoM followed for OSA, complicated by anxiety, fear of crowds, obesity, CPAP auto 5-20/ [facility]  ordered 07/11/23 Download compliance- Body weight today-  ROS-see HPI   + = positive Constitutional:    weight loss, night sweats, fevers, chills, fatigue, lassitude. HEENT:    headaches, difficulty swallowing, tooth/dental problems, sore throat,       sneezing, itching, ear ache, nasal congestion, post nasal drip, snoring CV:    chest pain, orthopnea, PND, swelling in lower extremities,  anasarca,                                   dizziness, palpitations Resp:   shortness of breath with exertion or at rest.                productive cough,   non-productive cough, coughing up of blood.              change in color of mucus.  wheezing.   Skin:    rash or lesions. GI: +heartburn, indigestion, abdominal pain, nausea, vomiting, diarrhea,                 change in bowel habits, loss of appetite GU: dysuria, change in color of urine, no urgency or frequency.   flank pain. MS:   joint pain, stiffness, decreased range of motion, back pain. Neuro-     nothing unusual Psych:  change in mood or affect.  depression or +anxiety.   memory loss.  OBJ- Physical Exam General- Alert, Oriented, Affect-appropriate, Distress- none acute Skin- rash-none, lesions- none, excoriation- none Lymphadenopathy- none Head- atraumatic            Eyes- Gross vision intact, PERRLA, conjunctivae and secretions clear, +dark circles under eyes            Ears- Hearing, canals-normal            Nose- Clear, no-Septal dev, mucus, polyps, erosion, perforation             Throat- Mallampati IV , mucosa clear , drainage-  none, tonsils+present, +teeth Neck- flexible , trachea midline, no stridor , thyroid nl, carotid no bruit Chest - symmetrical excursion , unlabored           Heart/CV- RRR , no murmur , no gallop  , no rub, nl s1 s2                           - JVD- none , edema- none, stasis changes- none, varices- none           Lung- clear to P&A, wheeze- none, cough- none , dullness-none, rub- none           Chest wall-  Abd-  Br/ Gen/ Rectal- Not done, not indicated Extrem- cyanosis- none, clubbing, none, atrophy- none, strength- nl Neuro- grossly intact to observation

## 2023-10-09 ENCOUNTER — Ambulatory Visit: Payer: 59 | Admitting: Internal Medicine

## 2023-10-10 ENCOUNTER — Encounter: Payer: Self-pay | Admitting: Internal Medicine

## 2023-12-19 ENCOUNTER — Telehealth: Payer: Self-pay

## 2023-12-19 NOTE — Telephone Encounter (Signed)
 Received CMN from [employer] for relmt cush for mask 2/month.  Placed on Dr. Sheryll desk in C POD for signature.  Will fax to [phone] once signed and dated.  Faxed information to [employer].
